# Patient Record
Sex: Female | Born: 1960 | Race: White | Hispanic: No | Smoking: Current every day smoker
Health system: Southern US, Community
[De-identification: ages and names within clinical notes are randomized; demographics above are authoritative.]

## PROBLEM LIST (undated history)

## (undated) DIAGNOSIS — G8929 Other chronic pain: Secondary | ICD-10-CM

## (undated) DIAGNOSIS — H409 Unspecified glaucoma: Secondary | ICD-10-CM

## (undated) DIAGNOSIS — J449 Chronic obstructive pulmonary disease, unspecified: Secondary | ICD-10-CM

## (undated) DIAGNOSIS — E039 Hypothyroidism, unspecified: Secondary | ICD-10-CM

## (undated) DIAGNOSIS — G459 Transient cerebral ischemic attack, unspecified: Secondary | ICD-10-CM

## (undated) DIAGNOSIS — I509 Heart failure, unspecified: Secondary | ICD-10-CM

## (undated) DIAGNOSIS — M549 Dorsalgia, unspecified: Secondary | ICD-10-CM

## (undated) DIAGNOSIS — E119 Type 2 diabetes mellitus without complications: Secondary | ICD-10-CM

## (undated) DIAGNOSIS — I10 Essential (primary) hypertension: Secondary | ICD-10-CM

## (undated) HISTORY — PX: CERVICAL SPINE SURGERY: SHX589

## (undated) HISTORY — PX: ABDOMINAL HYSTERECTOMY: SHX81

## (undated) HISTORY — PX: RECTAL TUMOR BY PROCTOTOMY EXCISION: SUR550

## (undated) HISTORY — PX: ROTATOR CUFF REPAIR: SHX139

## (undated) HISTORY — PX: APPENDECTOMY: SHX54

## (undated) HISTORY — PX: TONSILLECTOMY: SUR1361

## (undated) HISTORY — PX: CHOLECYSTECTOMY: SHX55

## (undated) HISTORY — PX: BACK SURGERY: SHX140

---

## 2008-06-12 ENCOUNTER — Emergency Department (HOSPITAL_BASED_OUTPATIENT_CLINIC_OR_DEPARTMENT_OTHER): Admission: EM | Admit: 2008-06-12 | Discharge: 2008-06-12 | Payer: Self-pay | Admitting: Emergency Medicine

## 2008-09-19 ENCOUNTER — Ambulatory Visit: Payer: Self-pay | Admitting: Diagnostic Radiology

## 2008-09-19 ENCOUNTER — Emergency Department (HOSPITAL_BASED_OUTPATIENT_CLINIC_OR_DEPARTMENT_OTHER): Admission: EM | Admit: 2008-09-19 | Discharge: 2008-09-19 | Payer: Self-pay | Admitting: Emergency Medicine

## 2008-09-19 ENCOUNTER — Emergency Department (HOSPITAL_BASED_OUTPATIENT_CLINIC_OR_DEPARTMENT_OTHER): Admission: EM | Admit: 2008-09-19 | Discharge: 2008-09-20 | Payer: Self-pay | Admitting: Emergency Medicine

## 2009-02-25 ENCOUNTER — Emergency Department (HOSPITAL_BASED_OUTPATIENT_CLINIC_OR_DEPARTMENT_OTHER): Admission: EM | Admit: 2009-02-25 | Discharge: 2009-02-26 | Payer: Self-pay | Admitting: Emergency Medicine

## 2009-02-26 ENCOUNTER — Ambulatory Visit: Payer: Self-pay | Admitting: Diagnostic Radiology

## 2009-02-27 ENCOUNTER — Emergency Department (HOSPITAL_COMMUNITY): Admission: EM | Admit: 2009-02-27 | Discharge: 2009-02-27 | Payer: Self-pay | Admitting: Emergency Medicine

## 2009-09-20 ENCOUNTER — Emergency Department (HOSPITAL_BASED_OUTPATIENT_CLINIC_OR_DEPARTMENT_OTHER): Admission: EM | Admit: 2009-09-20 | Discharge: 2009-09-20 | Payer: Self-pay | Admitting: Emergency Medicine

## 2009-09-20 ENCOUNTER — Ambulatory Visit: Payer: Self-pay | Admitting: Diagnostic Radiology

## 2010-02-11 ENCOUNTER — Emergency Department (HOSPITAL_BASED_OUTPATIENT_CLINIC_OR_DEPARTMENT_OTHER)
Admission: EM | Admit: 2010-02-11 | Discharge: 2010-02-11 | Disposition: A | Discharge status: Home / Self Care | Payer: Medicaid Other | Attending: Emergency Medicine | Admitting: Emergency Medicine

## 2010-02-11 DIAGNOSIS — E039 Hypothyroidism, unspecified: Secondary | ICD-10-CM | POA: Insufficient documentation

## 2010-02-11 DIAGNOSIS — G8929 Other chronic pain: Secondary | ICD-10-CM | POA: Insufficient documentation

## 2010-02-11 DIAGNOSIS — M5412 Radiculopathy, cervical region: Secondary | ICD-10-CM | POA: Insufficient documentation

## 2010-02-11 DIAGNOSIS — J4489 Other specified chronic obstructive pulmonary disease: Secondary | ICD-10-CM | POA: Insufficient documentation

## 2010-02-11 DIAGNOSIS — I252 Old myocardial infarction: Secondary | ICD-10-CM | POA: Insufficient documentation

## 2010-02-11 DIAGNOSIS — M79609 Pain in unspecified limb: Secondary | ICD-10-CM | POA: Insufficient documentation

## 2010-02-11 DIAGNOSIS — I10 Essential (primary) hypertension: Secondary | ICD-10-CM | POA: Insufficient documentation

## 2010-02-11 DIAGNOSIS — F17200 Nicotine dependence, unspecified, uncomplicated: Secondary | ICD-10-CM | POA: Insufficient documentation

## 2010-02-11 DIAGNOSIS — J449 Chronic obstructive pulmonary disease, unspecified: Secondary | ICD-10-CM | POA: Insufficient documentation

## 2010-03-29 LAB — DIFFERENTIAL
Basophils Absolute: 0.1 10*3/uL (ref 0.0–0.1)
Basophils Relative: 1 % (ref 0–1)
Eosinophils Absolute: 0.2 10*3/uL (ref 0.0–0.7)
Lymphs Abs: 2.8 10*3/uL (ref 0.7–4.0)
Monocytes Relative: 6 % (ref 3–12)
Neutro Abs: 6.4 10*3/uL (ref 1.7–7.7)

## 2010-03-29 LAB — CBC
MCHC: 34.8 g/dL (ref 30.0–36.0)
Platelets: 208 10*3/uL (ref 150–400)
RBC: 4.59 MIL/uL (ref 3.87–5.11)
RDW: 12.8 % (ref 11.5–15.5)

## 2010-03-29 LAB — BASIC METABOLIC PANEL
BUN: 14 mg/dL (ref 6–23)
GFR calc Af Amer: >60 mL/min (ref >60)
Glucose, Bld: 191 mg/dL — ABNORMAL HIGH (ref 70–99)
Sodium: 142 mEq/L (ref 135–145)

## 2010-03-29 LAB — POCT B-TYPE NATRIURETIC PEPTIDE (BNP): B Natriuretic Peptide, POC: <5 pg/mL (ref 0–100)

## 2010-03-29 LAB — POCT CARDIAC MARKERS
CKMB, poc: 1.9 ng/mL (ref 1.0–8.0)
Troponin i, poc: <0.05 ng/mL (ref 0.00–0.09)

## 2010-03-31 LAB — BASIC METABOLIC PANEL
CO2: 28 mEq/L (ref 19–32)
Calcium: 9.1 mg/dL (ref 8.4–10.5)
Chloride: 103 mEq/L (ref 96–112)
Creatinine, Ser: 0.94 mg/dL (ref 0.4–1.2)
GFR calc Af Amer: >60 mL/min (ref >60)
Glucose, Bld: 79 mg/dL (ref 70–99)
Potassium: 3.7 mEq/L (ref 3.5–5.1)

## 2010-03-31 LAB — POCT CARDIAC MARKERS
CKMB, poc: 2.8 ng/mL (ref 1.0–8.0)
Myoglobin, poc: 127 ng/mL (ref 12–200)

## 2010-03-31 LAB — CBC
HCT: 42 % (ref 36.0–46.0)
Hemoglobin: 14.3 g/dL (ref 12.0–15.0)
WBC: 9.3 10*3/uL (ref 4.0–10.5)

## 2010-03-31 LAB — DIFFERENTIAL
Basophils Absolute: 0.1 10*3/uL (ref 0.0–0.1)
Basophils Relative: 1 % (ref 0–1)
Lymphs Abs: 2.7 10*3/uL (ref 0.7–4.0)
Monocytes Absolute: 0.6 10*3/uL (ref 0.1–1.0)
Monocytes Relative: 7 % (ref 3–12)

## 2010-04-14 LAB — URINALYSIS, ROUTINE W REFLEX MICROSCOPIC
Bilirubin Urine: NEGATIVE
Ketones, ur: NEGATIVE mg/dL
Ketones, ur: NEGATIVE mg/dL
Nitrite: NEGATIVE
Nitrite: NEGATIVE
Protein, ur: NEGATIVE mg/dL
Specific Gravity, Urine: 1.017 (ref 1.005–1.030)
Urobilinogen, UA: 0.2 mg/dL (ref 0.0–1.0)
pH: 6 (ref 5.0–8.0)

## 2010-04-18 ENCOUNTER — Emergency Department (INDEPENDENT_AMBULATORY_CARE_PROVIDER_SITE_OTHER): Payer: Medicaid Other

## 2010-04-18 ENCOUNTER — Emergency Department (HOSPITAL_BASED_OUTPATIENT_CLINIC_OR_DEPARTMENT_OTHER)
Admission: EM | Admit: 2010-04-18 | Discharge: 2010-04-19 | Disposition: A | Discharge status: Home / Self Care | Payer: Medicaid Other | Attending: Emergency Medicine | Admitting: Emergency Medicine

## 2010-04-18 DIAGNOSIS — G8929 Other chronic pain: Secondary | ICD-10-CM | POA: Insufficient documentation

## 2010-04-18 DIAGNOSIS — J4489 Other specified chronic obstructive pulmonary disease: Secondary | ICD-10-CM | POA: Insufficient documentation

## 2010-04-18 DIAGNOSIS — W010XXA Fall on same level from slipping, tripping and stumbling without subsequent striking against object, initial encounter: Secondary | ICD-10-CM

## 2010-04-18 DIAGNOSIS — I252 Old myocardial infarction: Secondary | ICD-10-CM | POA: Insufficient documentation

## 2010-04-18 DIAGNOSIS — F17200 Nicotine dependence, unspecified, uncomplicated: Secondary | ICD-10-CM | POA: Insufficient documentation

## 2010-04-18 DIAGNOSIS — Y92009 Unspecified place in unspecified non-institutional (private) residence as the place of occurrence of the external cause: Secondary | ICD-10-CM | POA: Insufficient documentation

## 2010-04-18 DIAGNOSIS — J449 Chronic obstructive pulmonary disease, unspecified: Secondary | ICD-10-CM | POA: Insufficient documentation

## 2010-04-18 DIAGNOSIS — M25529 Pain in unspecified elbow: Secondary | ICD-10-CM

## 2010-04-18 DIAGNOSIS — S5000XA Contusion of unspecified elbow, initial encounter: Secondary | ICD-10-CM | POA: Insufficient documentation

## 2010-07-31 ENCOUNTER — Encounter: Payer: Self-pay | Admitting: *Deleted

## 2010-07-31 ENCOUNTER — Emergency Department (HOSPITAL_BASED_OUTPATIENT_CLINIC_OR_DEPARTMENT_OTHER)
Admission: EM | Admit: 2010-07-31 | Discharge: 2010-07-31 | Disposition: A | Discharge status: Home / Self Care | Payer: Medicaid Other | Attending: Emergency Medicine | Admitting: Emergency Medicine

## 2010-07-31 DIAGNOSIS — J4489 Other specified chronic obstructive pulmonary disease: Secondary | ICD-10-CM | POA: Insufficient documentation

## 2010-07-31 DIAGNOSIS — F17200 Nicotine dependence, unspecified, uncomplicated: Secondary | ICD-10-CM | POA: Insufficient documentation

## 2010-07-31 DIAGNOSIS — I10 Essential (primary) hypertension: Secondary | ICD-10-CM | POA: Insufficient documentation

## 2010-07-31 DIAGNOSIS — J449 Chronic obstructive pulmonary disease, unspecified: Secondary | ICD-10-CM | POA: Insufficient documentation

## 2010-07-31 DIAGNOSIS — I509 Heart failure, unspecified: Secondary | ICD-10-CM | POA: Insufficient documentation

## 2010-07-31 DIAGNOSIS — K602 Anal fissure, unspecified: Secondary | ICD-10-CM | POA: Insufficient documentation

## 2010-07-31 HISTORY — DX: Chronic obstructive pulmonary disease, unspecified: J44.9

## 2010-07-31 HISTORY — DX: Essential (primary) hypertension: I10

## 2010-07-31 HISTORY — DX: Heart failure, unspecified: I50.9

## 2010-07-31 MED ORDER — LIDOCAINE HCL 2 % EX GEL: Freq: Three times a day (TID) | CUTANEOUS | Status: AC

## 2010-07-31 NOTE — ED Provider Notes (Signed)
History     Chief Complaint  Patient presents with  . Tailbone Pain   HPI Comments: Pt statea that she started having rectal pain yesterday.pt denies any constipation or bleeding. Pt states that she took vicodin with some relief.  The history is provided by the patient. No language interpreter was used.    Past Medical History  Diagnosis Date  . Thyroid disease   . Hypertension   . CHF (congestive heart failure)   . COPD (chronic obstructive pulmonary disease)     Past Surgical History  Procedure Date  . Back surgery   . Cholecystectomy   . Abdominal hysterectomy   . 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 No pertinent family history.  History  Substance Use Topics  . Smoking status: Current Everyday Smoker -- 2.0 packs/day  . Smokeless tobacco: Not on file  . Alcohol Use: No    OB History    Grav Para Term Preterm Abortions TAB SAB Ect Mult Living                  Review of Systems  Constitutional: Negative.   Respiratory: Negative.   Cardiovascular: Negative.   Genitourinary:       Rectal pain  Musculoskeletal: Negative.   Neurological: Negative.   Psychiatric/Behavioral: Negative.     Physical Exam  BP 151/75  Pulse 92  Temp(Src) 98 F (36.7 C) (Oral)  Resp 18  SpO2 96%  Physical Exam  Constitutional: She appears well-developed and well-nourished.  Cardiovascular: Normal rate and regular rhythm.   Pulmonary/Chest: Breath sounds normal.  Genitourinary:    Musculoskeletal: Normal range of motion.  Neurological: She is alert.  Skin: Skin is warm.    ED Course  Procedures  MDM Pt has a tear:no sign of abcess or infection noted to the area:will treat for comfort      Teressa Lower, NP 07/31/10 2024

## 2010-07-31 NOTE — ED Notes (Signed)
Pt presents to ED today withs severe pain to rectal area.  Pt describes as sharp intermittent pain.  Pt took Vicodin around 6pm with minimal relief of sx.

## 2010-07-31 NOTE — ED Notes (Signed)
C/o rectal pain for few days which feel the same as hemorrhoids that she has had in the past. Denies any rectal bleeding. Pt states that pain has not improved after taking vicodin at home. No N/V, no abd pain.

## 2010-08-01 NOTE — ED Provider Notes (Signed)
Medical screening examination/treatment/procedure(s) were performed by non-physician practitioner and as supervising physician I was immediately available for consultation/collaboration.  Suzane Vanderweide 08/01/10 0043 

## 2010-08-08 ENCOUNTER — Telehealth (HOSPITAL_BASED_OUTPATIENT_CLINIC_OR_DEPARTMENT_OTHER): Payer: Self-pay | Admitting: Emergency Medicine

## 2010-12-09 ENCOUNTER — Emergency Department (INDEPENDENT_AMBULATORY_CARE_PROVIDER_SITE_OTHER): Payer: Medicaid Other

## 2010-12-09 ENCOUNTER — Encounter (HOSPITAL_BASED_OUTPATIENT_CLINIC_OR_DEPARTMENT_OTHER): Payer: Self-pay | Admitting: *Deleted

## 2010-12-09 ENCOUNTER — Emergency Department (HOSPITAL_BASED_OUTPATIENT_CLINIC_OR_DEPARTMENT_OTHER)
Admission: EM | Admit: 2010-12-09 | Discharge: 2010-12-09 | Disposition: A | Discharge status: Home / Self Care | Payer: Medicaid Other | Attending: Emergency Medicine | Admitting: Emergency Medicine

## 2010-12-09 DIAGNOSIS — R0989 Other specified symptoms and signs involving the circulatory and respiratory systems: Secondary | ICD-10-CM

## 2010-12-09 DIAGNOSIS — H6690 Otitis media, unspecified, unspecified ear: Secondary | ICD-10-CM

## 2010-12-09 DIAGNOSIS — R05 Cough: Secondary | ICD-10-CM

## 2010-12-09 DIAGNOSIS — J111 Influenza due to unidentified influenza virus with other respiratory manifestations: Secondary | ICD-10-CM | POA: Insufficient documentation

## 2010-12-09 DIAGNOSIS — I10 Essential (primary) hypertension: Secondary | ICD-10-CM | POA: Insufficient documentation

## 2010-12-09 DIAGNOSIS — J449 Chronic obstructive pulmonary disease, unspecified: Secondary | ICD-10-CM | POA: Insufficient documentation

## 2010-12-09 DIAGNOSIS — R0602 Shortness of breath: Secondary | ICD-10-CM

## 2010-12-09 DIAGNOSIS — I509 Heart failure, unspecified: Secondary | ICD-10-CM | POA: Insufficient documentation

## 2010-12-09 DIAGNOSIS — R059 Cough, unspecified: Secondary | ICD-10-CM | POA: Insufficient documentation

## 2010-12-09 DIAGNOSIS — R42 Dizziness and giddiness: Secondary | ICD-10-CM

## 2010-12-09 DIAGNOSIS — J4489 Other specified chronic obstructive pulmonary disease: Secondary | ICD-10-CM | POA: Insufficient documentation

## 2010-12-09 DIAGNOSIS — F17200 Nicotine dependence, unspecified, uncomplicated: Secondary | ICD-10-CM | POA: Insufficient documentation

## 2010-12-09 DIAGNOSIS — E079 Disorder of thyroid, unspecified: Secondary | ICD-10-CM | POA: Insufficient documentation

## 2010-12-09 MED ORDER — MECLIZINE HCL 25 MG PO TABS: 25.0000 mg | ORAL_TABLET | Freq: Three times a day (TID) | ORAL | Status: AC | PRN

## 2010-12-09 MED ORDER — AZITHROMYCIN 250 MG PO TABS: 250.0000 mg | ORAL_TABLET | Freq: Every day | ORAL | Status: AC

## 2010-12-09 MED ORDER — MECLIZINE HCL 25 MG PO TABS
25.0000 mg | ORAL_TABLET | Freq: Once | ORAL | Status: AC
  Administered 2010-12-09: 25 mg via ORAL
  Filled 2010-12-09: qty 1

## 2010-12-09 MED ORDER — AZITHROMYCIN 250 MG PO TABS
500.0000 mg | ORAL_TABLET | Freq: Once | ORAL | Status: AC
  Administered 2010-12-09: 500 mg via ORAL
  Filled 2010-12-09: qty 2

## 2010-12-09 MED ORDER — ALBUTEROL SULFATE (5 MG/ML) 0.5% IN NEBU
2.5000 mg | INHALATION_SOLUTION | Freq: Once | RESPIRATORY_TRACT | Status: AC
  Administered 2010-12-09: 2.5 mg via RESPIRATORY_TRACT
  Filled 2010-12-09: qty 0.5

## 2010-12-09 MED ORDER — IPRATROPIUM BROMIDE 0.02 % IN SOLN
0.5000 mg | Freq: Once | RESPIRATORY_TRACT | Status: AC
  Administered 2010-12-09: 0.5 mg via RESPIRATORY_TRACT
  Filled 2010-12-09: qty 2.5

## 2010-12-09 MED ORDER — ALBUTEROL SULFATE HFA 108 (90 BASE) MCG/ACT IN AERS: 2.0000 | INHALATION_SPRAY | Freq: Four times a day (QID) | RESPIRATORY_TRACT | Status: DC | PRN

## 2010-12-09 MED ORDER — HYDROCOD POLST-CHLORPHEN POLST 10-8 MG/5ML PO LQCR: 5.0000 mL | Freq: Two times a day (BID) | ORAL | Status: DC | PRN

## 2010-12-09 NOTE — Discharge Instructions (Signed)
Albuterol inhalation aerosol What is this medicine? ALBUTEROL (al Gaspar Bidding) is a bronchodilator. It helps open up the airways in your lungs to make it easier to breathe. This medicine is used to treat and to prevent bronchospasm. This medicine may be used for other purposes; ask your health care provider or pharmacist if you have questions. What should I tell my health care provider before I take this medicine? They need to know if you have any of the following conditions: -diabetes -heart disease or irregular heartbeat -high blood pressure -pheochromocytoma -seizures -thyroid disease -an unusual or allergic reaction to albuterol, levalbuterol, sulfites, other medicines, foods, dyes, or preservatives -pregnant or trying to get pregnant -breast-feeding How should I use this medicine? This medicine is for inhalation through the mouth. Follow the directions on your prescription label. Take your medicine at regular intervals. Do not use more often than directed. Make sure that you are using your inhaler correctly. Ask you doctor or health care provider if you have any questions. Use this medicine before you use any other inhaler. Wait 5 minutes or more before between using different inhalers. Talk to your pediatrician regarding the use of this medicine in children. Special care may be needed. Overdosage: If you think you have taken too much of this medicine contact a poison control center or emergency room at once. NOTE: This medicine is only for you. Do not share this medicine with others. What if I miss a dose? If you miss a dose, use it as soon as you can. If it is almost time for your next dose, use only that dose. Do not use double or extra doses. What may interact with this medicine? -anti-infectives like chloroquine and pentamidine -caffeine -cisapride -diuretics -medicines for colds -medicines for depression or for emotional or psychotic conditions -medicines for weight loss  including some herbal products -methadone -some antibiotics like clarithromycin, erythromycin, levofloxacin, and linezolid -some heart medicines -steroid hormones like dexamethasone, cortisone, hydrocortisone -theophylline -thyroid hormones This list may not describe all possible interactions. Give your health care provider a list of all the medicines, herbs, non-prescription drugs, or dietary supplements you use. Also tell them if you smoke, drink alcohol, or use illegal drugs. Some items may interact with your medicine. What should I watch for while using this medicine? Tell your doctor or health care professional if your symptoms do not improve. Do not use extra albuterol. If your asthma or bronchitis gets worse while you are using this medicine, call your doctor right away. If your mouth gets dry try chewing sugarless gum or sucking hard candy. Drink water as directed. What side effects may I notice from receiving this medicine? Side effects that you should report to your doctor or health care professional as soon as possible: -allergic reactions like skin rash, itching or hives, swelling of the face, lips, or tongue -breathing problems -chest pain -feeling faint or lightheaded, falls -high blood pressure -irregular heartbeat -fever -muscle cramps or weakness -pain, tingling, numbness in the hands or feet -vomiting Side effects that usually do not require medical attention (report to your doctor or health care professional if they continue or are bothersome): -cough -difficulty sleeping -headache -nervousness or trembling -stomach upset -stuffy or runny nose -throat irritation -unusual taste This list may not describe all possible side effects. Call your doctor for medical advice about side effects. You may report side effects to FDA at 1-800-FDA-1088. Where should I keep my medicine? Keep out of the reach of children. Store at  room temperature between 15 and 30 degrees C (59  and 86 degrees F). The contents are under pressure and may burst when exposed to heat or flame. Do not freeze. This medicine does not work as well if it is too cold. Throw away any unused medicine after the expiration date. Inhalers need to be thrown away after the labeled number of puffs have been used or by the expiration date; whichever comes first. Ventolin HFA should be thrown away 12 months after removing from foil pouch. Check the instructions that come with your medicine. NOTE: This sheet is a summary. It may not cover all possible information. If you have questions about this medicine, talk to your doctor, pharmacist, or health care provider.  2012, [employer]. (05/12/2010 11:00:52 AM)Meclizine tablets or capsules What is this medicine? MECLIZINE (MEK li zeen) is an antihistamine. It is used to prevent nausea, vomiting, or dizziness caused by motion sickness. It is also used to prevent and treat vertigo (extreme dizziness or a feeling that you or your surroundings are tilting or spinning around). This medicine may be used for other purposes; ask your health care provider or pharmacist if you have questions. What should I tell my health care provider before I take this medicine? They need to know if you have any of these conditions: -asthma -glaucoma -prostate trouble -stomach problems -urinary problems -an unusual or allergic reaction to meclizine, other medicines, foods, dyes, or preservatives -pregnant or trying to get pregnant -breast-feeding How should I use this medicine? Take this medicine by mouth with a glass of water. Follow the directions on the prescription label. If you are using this medicine to prevent motion sickness, take the dose at least 1 hour before travel. If it upsets your stomach, take it with food or milk. Take your doses at regular intervals. Do not take your medicine more often than directed. Talk to your pediatrician regarding the use of this medicine  in children. Special care may be needed. Overdosage: If you think you have taken too much of this medicine contact a poison control center or emergency room at once. NOTE: This medicine is only for you. Do not share this medicine with others. What if I miss a dose? If you miss a dose, take it as soon as you can. If it is almost time for your next dose, take only that dose. Do not take double or extra doses. What may interact with this medicine? -barbiturate medicines for inducing sleep or treating seizures -digoxin -medicines for anxiety or sleeping problems, like alprazolam, diazepam or temazepam -medicines for hay fever and other allergies -medicines for mental depression -medicines for movement abnormalities as in Parkinson's disease, or for stomach problems -medicines for pain -medicines that relax muscles This list may not describe all possible interactions. Give your health care provider a list of all the medicines, herbs, non-prescription drugs, or dietary supplements you use. Also tell them if you smoke, drink alcohol, or use illegal drugs. Some items may interact with your medicine. What should I watch for while using this medicine? If you are taking this medicine on a regular schedule, visit your doctor or health care professional for regular checks on your progress. You may get dizzy, drowsy or have blurred vision. Do not drive, use machinery, or do anything that needs mental alertness until you know how this medicine affects you. Do not stand or sit up quickly, especially if you are an older patient. This reduces the risk of dizzy or fainting spells.  Alcohol can increase possible dizziness. Avoid alcoholic drinks. Your mouth may get dry. Chewing sugarless gum or sucking hard candy, and drinking plenty of water may help. Contact your doctor if the problem does not go away or is severe. This medicine may cause dry eyes and blurred vision. If you wear contact lenses you may feel some  discomfort. Lubricating drops may help. See your eye doctor if the problem does not go away or is severe. What side effects may I notice from receiving this medicine? Side effects that you should report to your doctor or health care professional as soon as possible: -fainting spells -fast or irregular heartbeat Side effects that usually do not require medical attention (report to your doctor or health care professional if they continue or are bothersome): -constipation -difficulty passing urine -difficulty sleeping -headache -stomach upset This list may not describe all possible side effects. Call your doctor for medical advice about side effects. You may report side effects to FDA at 1-800-FDA-1088. Where should I keep my medicine? Keep out of the reach of children. Store at room temperature between 15 and 30 degrees C (59 and 86 degrees F). Keep container tightly closed. Throw away any unused medicine after the expiration date. NOTE: This sheet is a summary. It may not cover all possible information. If you have questions about this medicine, talk to your doctor, pharmacist, or health care provider.  2012, [employer] Standard. (07/03/2007 10:35:36 AM)Hydrocodone oral syrup  What is this medicine? HYDROCODONE (hye droe KOE done) is used to help relieve cough. This medicine may be used for other purposes; ask your health care provider or pharmacist if you have questions. What should I tell my health care provider before I take this medicine? They need to know if you have any of these conditions: -brain tumor -drug abuse or addiction -head injury -heart disease -if you frequently drink alcohol-containing drinks -kidney disease -liver disease -lung disease, asthma, or breathing problems -mental problems -an allergic reaction to hydrocodone, other opioid analgesics, other medicines, foods, dyes, or preservatives -pregnant or trying to get pregnant -breast-feeding How should I use  this medicine? Take this medicine by mouth with a full glass of water. Use a specially marked spoon or dropper to measure your dose. Ask your pharmacist if you do not have one. Do not use a household spoon. Follow the directions on the prescription label. Take your medicine at regular intervals. Do not take your medicine more often than directed. Talk to your pediatrician regarding the use of this medicine in children. This medicine is not approved for use in children less than 18 years old. Patients over 17 years old may have a stronger reaction and need a smaller dose. Overdosage: If you think you have taken too much of this medicine contact a poison control center or emergency room at once. NOTE: This medicine is only for you. Do not share this medicine with others. What if I miss a dose? If you miss a dose, take it as soon as you can. If it is almost time for your next dose, take only that dose. Do not take double or extra doses. What may interact with this medicine? -alcohol or medicines that contain alcohol -antihistamines -MAOIs like Carbex, Eldepryl, Marplan, Nardil, and Parnate -medicines for depression, anxiety, or psychotic disturbances -medicines for pain -medicines for sleep -naltrexone -tricyclic antidepressants like amitriptyline, clomipramine, desipramine, doxepin, imipramine, nortriptyline, and protriptyline This list may not describe all possible interactions. Give your health care provider a list of  all the medicines, herbs, non-prescription drugs, or dietary supplements you use. Also tell them if you smoke, drink alcohol, or use illegal drugs. Some items may interact with your medicine. What should I watch for while using this medicine? You may develop tolerance to this medicine if you take it for a long time. Tolerance means that you will get less cough relief with time. Tell your doctor or health care professional if your symptoms do not improve or if they get worse. You may  get drowsy or dizzy. Do not drive, use machinery, or do anything that needs mental alertness until you know how this medicine affects you. Do not stand or sit up quickly, especially if you are an older patient. This reduces the risk of dizzy or fainting spells. Alcohol may interfere with the effect of this medicine. Avoid alcoholic drinks. This medicine will cause constipation. Try to have a bowel movement at least every 2 to 3 days. If you do not have a bowel movement for 3 days, call your doctor or health care professional. What side effects may I notice from receiving this medicine? Side effects that you should report to your doctor or health care professional as soon as possible: -allergic reactions like skin rash, itching or hives, swelling of the face, lips, or tongue -breathing difficulties, wheezing -confusion -light headedness or fainting spells Side effects that usually do not require medical attention (report to your doctor or health care professional if they continue or are bothersome): -dizziness -drowsiness -itching -nausea -vomiting This list may not describe all possible side effects. Call your doctor for medical advice about side effects. You may report side effects to FDA at 1-800-FDA-1088. Where should I keep my medicine? Keep out of the reach of children. This medicine can be abused. Keep your medicine in a safe place to protect it from theft. Do not share this medicine with anyone. Selling or giving away this medicine is dangerous and against the law. Store at room temperature between 15 and 30 degrees C (59 and 86 degrees F). Protect from light. Throw away any unused medicine after the expiration date. NOTE: This sheet is a summary. It may not cover all possible information. If you have questions about this medicine, talk to your doctor, pharmacist, or health care provider.  2012, [employer]/Gold Standard. (05/09/2007 2:42:20 PM)Vertigo Vertigo means you feel like you or your  surroundings are moving when they are not. Vertigo can be dangerous if it occurs when you are at work, driving, or performing difficult activities.  CAUSES  Vertigo occurs when there is a conflict of signals sent to your brain from the visual and sensory systems in your body. There are many different causes of vertigo, including:  Infections, especially in the inner ear.   A bad reaction to a drug or misuse of alcohol and medicines.   Withdrawal from drugs or alcohol.   Rapidly changing positions, such as lying down or rolling over in bed.   A migraine headache.   Decreased blood flow to the brain.   Increased pressure in the brain from a head injury, infection, tumor, or bleeding.  SYMPTOMS  You may feel as though the world is spinning around or you are falling to the ground. Because your balance is upset, vertigo can cause nausea and vomiting. You may have involuntary eye movements (nystagmus). DIAGNOSIS  Vertigo is usually diagnosed by physical exam. If the cause of your vertigo is unknown, your caregiver may perform imaging tests, such as an MRI  scan (magnetic resonance imaging). TREATMENT  Most cases of vertigo resolve on their own, without treatment. Depending on the cause, your caregiver may prescribe certain medicines. If your vertigo is related to body position issues, your caregiver may recommend movements or procedures to correct the problem. In rare cases, if your vertigo is caused by certain inner ear problems, you may need surgery. HOME CARE INSTRUCTIONS   Follow your caregiver's instructions.   Avoid driving.   Avoid operating heavy machinery.   Avoid performing any tasks that would be dangerous to you or others during a vertigo episode.   Tell your caregiver if you notice that certain medicines seem to be causing your vertigo. Some of the medicines used to treat vertigo episodes can actually make them worse in some people.  SEEK IMMEDIATE MEDICAL CARE IF:   Your  medicines do not relieve your vertigo or are making it worse.   You develop problems with talking, walking, weakness, or using your arms, hands, or legs.   You develop severe headaches.   Your nausea or vomiting continues or gets worse.   You develop visual changes.   A family member notices behavioral changes.   Your condition gets worse.  MAKE SURE YOU:  Understand these instructions.   Will watch your condition.   Will get help right away if you are not doing well or get worse.  Document Released: 10/04/2004 Document Revised: 09/06/2010 Document Reviewed: 07/13/2010 [facility] Patient Information 2012 [place], [place].Otitis Media, Adult A middle ear infection is an infection in the space behind the eardrum. The medical name for this is "otitis media." It may happen after a common cold. It is caused by a germ that starts growing in that space. You may feel swollen glands in your neck on the side of the ear infection. HOME CARE INSTRUCTIONS   Take your medicine as directed until it is gone, even if you feel better after the first few days.   Only take over-the-counter or prescription medicines for pain, discomfort, or fever as directed by your caregiver.   Occasional use of a nasal decongestant a couple times per day may help with discomfort and help the eustachian tube to drain better.  Follow up with your caregiver in 10 to 14 days or as directed, to be certain that the infection has cleared. Not keeping the appointment could result in a chronic or permanent injury, pain, hearing loss and disability. If there is any problem keeping the appointment, you must call back to this facility for assistance. SEEK IMMEDIATE MEDICAL CARE IF:   You are not getting better in 2 to 3 days.   You have pain that is not controlled with medication.   You feel worse instead of better.   You cannot use the medication as directed.   You develop swelling, redness or pain around the ear or  stiffness in your neck.  MAKE SURE YOU:   Understand these instructions.   Will watch your condition.   Will get help right away if you are not doing well or get worse.  Document Released: 09/30/2003 Document Revised: 09/06/2010 Document Reviewed: 08/01/2007 [facility] Patient Information 2012 [place], [place].Influenza Facts Flu (influenza) is a contagious respiratory illness caused by the influenza viruses. It can cause mild to severe illness. While most healthy people recover from the flu without specific treatment and without complications, older people, young children, and people with certain health conditions are at higher risk for serious complications from the flu, including death. CAUSES  The flu virus is spread from person to person by respiratory droplets from coughing and sneezing.   A person can also become infected by touching an object or surface with a virus on it and then touching their mouth, eye or nose.   Adults may be able to infect others from 1 day before symptoms occur and up to 7 days after getting sick. So it is possible to give someone the flu even before you know you are sick and continue to infect others while you are sick.  SYMPTOMS   Fever (usually high).   Headache.   Tiredness (can be extreme).   Cough.   Sore throat.   Runny or stuffy nose.   Body aches.   Diarrhea and vomiting may also occur, particularly in children.   These symptoms are referred to as "flu-like symptoms". A lot of different illnesses, including the common cold, can have similar symptoms.  DIAGNOSIS   There are tests that can determine if you have the flu as long you are tested within the first 2 or 3 days of illness.   A doctor's exam and additional tests may be needed to identify if you have a disease that is a complicating the flu.  RISKS AND COMPLICATIONS  Some of the complications caused by the flu include:  Bacterial pneumonia or progressive pneumonia caused by the  flu virus.   Loss of body fluids (dehydration).   Worsening of chronic medical conditions, such as heart failure, asthma, or diabetes.   Sinus problems and ear infections.  HOME CARE INSTRUCTIONS   Seek medical care early on.   If you are at high risk from complications of the flu, consult your health-care provider as soon as you develop flu-like symptoms. Those at high risk for complications include:   People 65 years or older.   People with chronic medical conditions, including diabetes.   Pregnant women.   Young children.   Your caregiver may recommend use of an antiviral medication to help treat the flu.   If you get the flu, get plenty of rest, drink a lot of liquids, and avoid using alcohol and tobacco.   You can take over-the-counter medications to relieve the symptoms of the flu if your caregiver approves. (Never give aspirin to children or teenagers who have flu-like symptoms, particularly fever).  PREVENTION  The single best way to prevent the flu is to get a flu vaccine each fall. Other measures that can help protect against the flu are:  Antiviral Medications   A number of antiviral drugs are approved for use in preventing the flu. These are prescription medications, and a doctor should be consulted before they are used.   Habits for Good Health   Cover your nose and mouth with a tissue when you cough or sneeze, throw the tissue away after you use it.   Wash your hands often with soap and water, especially after you cough or sneeze. If you are not near water, use an alcohol-based hand cleaner.   Avoid people who are sick.   If you get the flu, stay home from work or school. Avoid contact with other people so that you do not make them sick, too.   Try not to touch your eyes, nose, or mouth as germs ore often spread this way.  IN CHILDREN, EMERGENCY WARNING SIGNS THAT NEED URGENT MEDICAL ATTENTION:  Fast breathing or trouble breathing.   Bluish skin color.     Not drinking enough fluids.  Not waking up or not interacting.   Being so irritable that the child does not want to be held.   Flu-like symptoms improve but then return with fever and worse cough.   Fever with a rash.  IN ADULTS, EMERGENCY WARNING SIGNS THAT NEED URGENT MEDICAL ATTENTION:  Difficulty breathing or shortness of breath.   Pain or pressure in the chest or abdomen.   Sudden dizziness.   Confusion.   Severe or persistent vomiting.  SEEK IMMEDIATE MEDICAL CARE IF:  You or someone you know is experiencing any of the symptoms above. When you arrive at the emergency center,report that you think you have the flu. You may be asked to wear a mask and/or sit in a secluded area to protect others from getting sick. MAKE SURE YOU:   Understand these instructions.   Monitor your condition.   Seek medical care if you are getting worse, or not improving.  Document Released: 12/28/2002 Document Revised: 09/06/2010 Document Reviewed: 09/23/2008 [facility] Patient Information 2012 [place], [place].

## 2010-12-09 NOTE — ED Provider Notes (Signed)
History  Scribed for Dione Booze, MD, the patient was seen in room MH11/MH11. This chart was scribed by Candelaria Stagers. The patient's care started at 5:54PM   CSN: [id number] Arrival date & time: 12/09/2010  5:46 PM   First MD Initiated Contact with Patient 12/09/10 1752      Chief Complaint  Patient presents with  . Cough     HPI Jamie Wiggins is a 50 y.o. female who presents to the Emergency Department complaining of cough and congestion that started about three days ago and has gotten worse today.  She is complaining of SOB that is associated with cough as well as dizziness and body aches.  She has no fever or chills.  Patient states she has not tried anything to alleviate sx.  She has a h/o COPD and congestive heart failure.  She recently stopped using inhaler for COPD about six months ago.  She smokes 1 1/2 packs per day.  Her PCP is Dr. Melvyn Novas.    Past Medical History  Diagnosis Date  . Thyroid disease   . Hypertension   . CHF (congestive heart failure)   . COPD (chronic obstructive pulmonary disease)     Past Surgical History  Procedure Date  . Back surgery   . Cholecystectomy   . Abdominal hysterectomy   . 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 No pertinent family history.  History  Substance Use Topics  . Smoking status: Current Everyday Smoker -- 2.0 packs/day  . Smokeless tobacco: Not on file  . Alcohol Use: No    OB History    Grav Para Term Preterm Abortions TAB SAB Ect Mult Living                  Review of Systems  Constitutional: Negative for fever and chills.  HENT: Negative for congestion.   Respiratory: Positive for cough and shortness of breath (associated with coughing).   Gastrointestinal: Negative for vomiting and diarrhea.  Neurological: Positive for dizziness.  10 Systems reviewed and are negative for acute change except as noted in the HPI.   Allergies  Demerol and Penicillins  Home Medications   Current  Outpatient Rx  Name Route Sig Dispense Refill  . FUROSEMIDE 20 MG PO TABS Oral Take 20 mg by mouth daily.     Marland Kitchen HYDROCODONE-ACETAMINOPHEN 10-325 MG PO TABS Oral Take 1 tablet by mouth every 6 (six) hours as needed. For pain      . LEVOTHYROXINE SODIUM 125 MCG PO TABS Oral Take 125 mcg by mouth daily.      Marland Kitchen LIDOCAINE HCL 2 % EX GEL Topical Apply topically 3 (three) times daily. Apply to the rectum 30 mL 0  . LISINOPRIL 20 MG PO TABS Oral Take 20 mg by mouth daily.      Marland Kitchen SIMVASTATIN 20 MG PO TABS Oral Take 20 mg by mouth daily.       BP 124/79  Pulse 78  Temp(Src) 98.1 F (36.7 C) (Oral)  Ht 5\' 1"  (1.549 m)  Wt 190 lb (86.183 kg)  BMI 35.90 kg/m2  SpO2 96%  Physical Exam  Nursing note and vitals reviewed. Constitutional: She is oriented to person, place, and time. She appears well-developed and well-nourished. No distress.  HENT:  Head: Normocephalic and atraumatic.  Mouth/Throat: Oropharynx is clear and moist.       Buldging and mild erythemia of both tympanic membranes. Tender palpaltion of both maxilarry sinuses.  Eyes: EOM are normal.  No scleral icterus.  Neck: Normal range of motion. Neck supple.  Cardiovascular: Normal rate and regular rhythm.   Pulmonary/Chest: She has wheezes (upper lobes).  Abdominal: Soft. She exhibits no distension.  Neurological: She is alert and oriented to person, place, and time.  Skin: Skin is warm and dry.  Psychiatric: She has a normal mood and affect. Her behavior is normal.    ED Course  Procedures   DIAGNOSTIC STUDIES: Oxygen Saturation is 96% on room air, normal by my interpretation.    COORDINATION OF CARE:  18:04 Ordered: ipratropium (ATROVENT) nebulizer solution 0.5 mg ; DG Chest 2 View ; meclizine (ANTIVERT) tablet 25 mg ; azithromycin (ZITHROMAX) tablet 500 mg, albuterol (PROVENTIL) (5 MG/ML) 0.5% nebulizer solution 2.5 mg     Labs Reviewed - No data to display Dg Chest 2 View  12/09/2010  *RADIOLOGY REPORT*  Clinical  Data: Cough, shortness of breath, congestion  CHEST - 2 VIEW  Comparison: CT chest dated February 26, 2009  Findings: The cardiac silhouette, mediastinum, pulmonary vasculature are within normal limits.  There is mild, diffuse prominence of the interstitial markings, consistent with an element of interstitial fibrotic disease.  No focal infiltrates or effusions are identified. A tiny right lung nodule is stable.  IMPRESSION: Mild chronic interstitial fibrotic changes.  Original Report Authenticated By: Brandon Melnick, M.D.     No diagnosis found.  Reevaluation at 7:25 PM: She states that she is not feeling any better, although she has not been coughing. On reexam, the lungs are clear.  MDM  Probable influenza with secondary otitis media and secondary vertigo.   I personally performed the services described in this documentation, which was scribed in my presence. The recorded information has been reviewed and considered.   Dione Booze, MD 12/09/10 [phone]

## 2010-12-09 NOTE — ED Notes (Signed)
Pt states she has had congestion/ cough for a couple days. Dizzy. Ears feel stopped up. "Tunnel"

## 2011-05-31 ENCOUNTER — Emergency Department (HOSPITAL_BASED_OUTPATIENT_CLINIC_OR_DEPARTMENT_OTHER)
Admission: EM | Admit: 2011-05-31 | Discharge: 2011-05-31 | Disposition: A | Discharge status: Home / Self Care | Payer: Medicaid Other | Attending: Emergency Medicine | Admitting: Emergency Medicine

## 2011-05-31 ENCOUNTER — Encounter (HOSPITAL_BASED_OUTPATIENT_CLINIC_OR_DEPARTMENT_OTHER): Payer: Self-pay | Admitting: *Deleted

## 2011-05-31 DIAGNOSIS — I10 Essential (primary) hypertension: Secondary | ICD-10-CM | POA: Insufficient documentation

## 2011-05-31 DIAGNOSIS — J4489 Other specified chronic obstructive pulmonary disease: Secondary | ICD-10-CM | POA: Insufficient documentation

## 2011-05-31 DIAGNOSIS — R51 Headache: Secondary | ICD-10-CM

## 2011-05-31 DIAGNOSIS — I509 Heart failure, unspecified: Secondary | ICD-10-CM | POA: Insufficient documentation

## 2011-05-31 DIAGNOSIS — T63481A Toxic effect of venom of other arthropod, accidental (unintentional), initial encounter: Secondary | ICD-10-CM | POA: Insufficient documentation

## 2011-05-31 DIAGNOSIS — J449 Chronic obstructive pulmonary disease, unspecified: Secondary | ICD-10-CM | POA: Insufficient documentation

## 2011-05-31 DIAGNOSIS — T6391XA Toxic effect of contact with unspecified venomous animal, accidental (unintentional), initial encounter: Secondary | ICD-10-CM | POA: Insufficient documentation

## 2011-05-31 DIAGNOSIS — R229 Localized swelling, mass and lump, unspecified: Secondary | ICD-10-CM | POA: Insufficient documentation

## 2011-05-31 DIAGNOSIS — T7840XA Allergy, unspecified, initial encounter: Secondary | ICD-10-CM

## 2011-05-31 MED ORDER — KETOROLAC TROMETHAMINE 60 MG/2ML IM SOLN
60.0000 mg | Freq: Once | INTRAMUSCULAR | Status: AC
  Administered 2011-05-31: 60 mg via INTRAMUSCULAR
  Filled 2011-05-31: qty 2

## 2011-05-31 MED ORDER — DIPHENHYDRAMINE HCL 25 MG PO CAPS
25.0000 mg | ORAL_CAPSULE | Freq: Once | ORAL | Status: AC
  Administered 2011-05-31: 25 mg via ORAL
  Filled 2011-05-31 (×2): qty 1

## 2011-05-31 NOTE — Discharge Instructions (Signed)
Allergic Reaction  Allergic reactions can be caused by anything your body is sensitive to. Your body may be sensitive to food, medicines, molds, pollens, cockroaches, dust mites, pets, insect stings, and other things around you. An allergic reaction may cause puffiness (swelling), itching, sneezing, coughing, or problems breathing.   Allergies cannot be cured, but they can be controlled with medicine. Some allergies happen only at certain times of the year. Try to stay away from what causes your reaction if possible. Sometimes, it is hard to tell what causes your reaction.  HOME CARE  If you have a rash or red patches (hives) on your skin:   Take medicines as told by your doctor.   Do not drive or drink alcohol after taking medicines. They can make you sleepy.   Put cold cloths on your skin. Take baths in cool water. This will help your itching. Do not take hot baths or showers. Heat will make the itching worse.   If your allergies get worse, your doctor might give you other medicines. Talk to your doctor if problems continue.  GET HELP RIGHT AWAY IF:    You have trouble breathing.   You have a tight feeling in your chest or throat.   Your mouth gets puffy (swollen).   You have red, itchy patches on your skin (hives) that get worse.   You have itching all over your body.  MAKE SURE YOU:    Understand these instructions.   Will watch your condition.   Will get help right away if you are not doing well or get worse.  Document Released: 12/13/2008 Document Revised: 12/14/2010 Document Reviewed: 12/13/2008  [employer] Patient Information 2012 [employer].

## 2011-05-31 NOTE — ED Notes (Signed)
2 hours ago was bit by an insect. Tightness in her chest and sob. Does not appear to be in distress.

## 2011-05-31 NOTE — ED Provider Notes (Signed)
History     CSN: [id number]  Arrival date & time 05/31/11  1946   First MD Initiated Contact with Patient 05/31/11 2036      No chief complaint on file.   (Consider location/radiation/quality/duration/timing/severity/associated sxs/prior treatment) HPI Comments: Patient presents after an insect bit her on her right upper arm approximately 640 this evening.  She states that it hurt and she had immediate onset of headache and felt some tightness in her chest.  She states that now that chest tightness has resolved.  She had no nausea or vomiting.  She still has some mild headache.  She noted the swelling that was on her upper arm has decreased without intervention.  Patient called her primary care physician's nursing hotline and they directed her to come into the emergency department.  The history is provided by the patient. No language interpreter was used.    Past Medical History  Diagnosis Date  . Thyroid disease   . Hypertension   . CHF (congestive heart failure)   . COPD (chronic obstructive pulmonary disease)     Past Surgical History  Procedure Date  . Back surgery   . Cholecystectomy   . Abdominal hysterectomy   . 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 No pertinent family history.  History  Substance Use Topics  . Smoking status: Current Everyday Smoker -- 2.0 packs/day  . Smokeless tobacco: Not on file  . Alcohol Use: No    OB History    Grav Para Term Preterm Abortions TAB SAB Ect Mult Living                  Review of Systems  Constitutional: Negative.  Negative for fever and chills.  Eyes: Negative.  Negative for discharge and redness.  Respiratory: Negative.  Negative for cough and shortness of breath.   Cardiovascular: Negative.  Negative for chest pain.  Gastrointestinal: Negative.  Negative for nausea, vomiting, abdominal pain and diarrhea.  Genitourinary: Negative.  Negative for dysuria and vaginal discharge.  Musculoskeletal:  Negative.  Negative for back pain.  Skin: Negative for color change and rash.  Neurological: Positive for headaches. Negative for syncope.  Hematological: Negative.  Negative for adenopathy.  Psychiatric/Behavioral: Negative.  Negative for confusion.  All other systems reviewed and are negative.    Allergies  Demerol and Penicillins  Home Medications   Current Outpatient Rx  Name Route Sig Dispense Refill  . ALBUTEROL SULFATE HFA 108 (90 BASE) MCG/ACT IN AERS Inhalation Inhale 2 puffs into the lungs every 6 (six) hours as needed for wheezing. 1 Inhaler 0  . HYDROCOD POLST-CPM POLST ER 10-8 MG/5ML PO LQCR Oral Take 5 mLs by mouth every 12 (twelve) hours as needed. 115 mL 0  . FUROSEMIDE 20 MG PO TABS Oral Take 20 mg by mouth daily.     Marland Kitchen HYDROCODONE-ACETAMINOPHEN 10-325 MG PO TABS Oral Take 1 tablet by mouth every 6 (six) hours as needed. For pain      . LEVOTHYROXINE SODIUM 125 MCG PO TABS Oral Take 125 mcg by mouth daily.      Marland Kitchen LIDOCAINE HCL 2 % EX GEL Topical Apply topically 3 (three) times daily. Apply to the rectum 30 mL 0  . LISINOPRIL 20 MG PO TABS Oral Take 20 mg by mouth daily.      Marland Kitchen SIMVASTATIN 20 MG PO TABS Oral Take 20 mg by mouth daily.       BP 124/72  Pulse 88  Temp(Src)  98.1 F (36.7 C) (Oral)  Resp 20  SpO2 98%  Physical Exam  Nursing note and vitals reviewed. Constitutional: She is oriented to person, place, and time. She appears well-developed and well-nourished.  Non-toxic appearance. She does not have a sickly appearance.  HENT:  Head: Normocephalic and atraumatic.  Mouth/Throat: Oropharynx is clear and moist.       Uvula is midline and there is no swelling  Eyes: Conjunctivae, EOM and lids are normal. Pupils are equal, round, and reactive to light. No scleral icterus.  Neck: Trachea normal and normal range of motion. Neck supple.  Cardiovascular: Normal rate, regular rhythm and normal heart sounds.   Pulmonary/Chest: Effort normal. No respiratory  distress. She has no wheezes. She has rales.  Abdominal: Soft. Normal appearance. There is no tenderness. There is no rebound, no guarding and no CVA tenderness.  Musculoskeletal: Normal range of motion.  Lymphadenopathy:    She has no cervical adenopathy.  Neurological: She is alert and oriented to person, place, and time. She has normal strength.  Skin: Skin is warm, dry and intact. No rash noted.       1 cm slightly raised area on right upper arm.  No other hives are present.  Psychiatric: She has a normal mood and affect. Her behavior is normal. Judgment and thought content normal.    ED Course  Procedures (including critical care time)  Labs Reviewed - No data to display No results found.   No diagnosis found.    Date: 05/31/2011  Rate: 79  Rhythm: normal sinus rhythm  QRS Axis: normal  Intervals: normal  ST/T Wave abnormalities: normal  Conduction Disutrbances:none  Narrative Interpretation:   Old EKG Reviewed: unchanged from 02/27/2009   MDM  Patient with very mild possible allergic reaction.  She has no significant hives.  Because she did have some mild swelling at the site of the insect bite I will give her a dose of by mouth Benadryl.  Patient still reports headache which I will treat with Toradol here.  I believe patient is safe for discharge home.        Nat Christen, MD 05/31/11 2052

## 2012-01-11 ENCOUNTER — Encounter (HOSPITAL_BASED_OUTPATIENT_CLINIC_OR_DEPARTMENT_OTHER): Payer: Self-pay | Admitting: *Deleted

## 2012-01-11 ENCOUNTER — Emergency Department (HOSPITAL_BASED_OUTPATIENT_CLINIC_OR_DEPARTMENT_OTHER)
Admission: EM | Admit: 2012-01-11 | Discharge: 2012-01-11 | Disposition: A | Discharge status: Home / Self Care | Payer: Medicaid Other | Attending: Emergency Medicine | Admitting: Emergency Medicine

## 2012-01-11 DIAGNOSIS — J4489 Other specified chronic obstructive pulmonary disease: Secondary | ICD-10-CM | POA: Insufficient documentation

## 2012-01-11 DIAGNOSIS — F17200 Nicotine dependence, unspecified, uncomplicated: Secondary | ICD-10-CM | POA: Insufficient documentation

## 2012-01-11 DIAGNOSIS — J069 Acute upper respiratory infection, unspecified: Secondary | ICD-10-CM

## 2012-01-11 DIAGNOSIS — H6690 Otitis media, unspecified, unspecified ear: Secondary | ICD-10-CM | POA: Insufficient documentation

## 2012-01-11 DIAGNOSIS — I509 Heart failure, unspecified: Secondary | ICD-10-CM | POA: Insufficient documentation

## 2012-01-11 DIAGNOSIS — Z79899 Other long term (current) drug therapy: Secondary | ICD-10-CM | POA: Insufficient documentation

## 2012-01-11 DIAGNOSIS — J449 Chronic obstructive pulmonary disease, unspecified: Secondary | ICD-10-CM | POA: Insufficient documentation

## 2012-01-11 DIAGNOSIS — E079 Disorder of thyroid, unspecified: Secondary | ICD-10-CM | POA: Insufficient documentation

## 2012-01-11 DIAGNOSIS — I10 Essential (primary) hypertension: Secondary | ICD-10-CM | POA: Insufficient documentation

## 2012-01-11 MED ORDER — AZITHROMYCIN 250 MG PO TABS: ORAL_TABLET | ORAL | Status: DC

## 2012-01-11 MED ORDER — HYDROCOD POLST-CHLORPHEN POLST 10-8 MG/5ML PO LQCR: 5.0000 mL | Freq: Two times a day (BID) | ORAL | Status: DC | PRN

## 2012-01-11 NOTE — ED Notes (Signed)
Earache headache and sore throat for a few days.

## 2012-01-11 NOTE — ED Provider Notes (Signed)
History     CSN: [id number]  Arrival date & time 01/11/12  1731   First MD Initiated Contact with Patient 01/11/12 1848      Chief Complaint  Patient presents with  . Otalgia    (Consider location/radiation/quality/duration/timing/severity/associated sxs/prior treatment) HPI 52 year old female presents to the ED with chief complaint of right ear pain, headache, myalgias, malaise, nonproductive cough for the past 2 days.  Patient states that she can feel "fluid sloshing around" in her right ear.  She has severe pain that radiates into the jaw.  She has some scratchy sore throat without difficulty breathing or swallowing.  Denies fevers. Denies DOE, SOB, chest tightness or pressure, radiation to left arm, jaw or back, or diaphoresis. Denies dysuria, flank pain, suprapubic pain, frequency, urgency, or hematuria. Denies headaches, light headedness, weakness, visual disturbances. Denies abdominal pain, nausea, vomiting, diarrhea or constipation.  ' Past Medical History  Diagnosis Date  . Thyroid disease   . Hypertension   . CHF (congestive heart failure)   . COPD (chronic obstructive pulmonary disease)     Past Surgical History  Procedure Date  . Back surgery   . Cholecystectomy   . Abdominal hysterectomy   . 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  History  Substance Use Topics  . Smoking status: Current Every Day Smoker -- 2.0 packs/day    Types: Cigarettes  . Smokeless tobacco: Not on file  . Alcohol Use: No    OB History    Grav Para Term Preterm Abortions TAB SAB Ect Mult Living                  Review of Systems Ten systems reviewed and are negative for acute change, except as noted in the HPI.  Allergies  Demerol and Penicillins  Home Medications   Current Outpatient Rx  Name  Route  Sig  Dispense  Refill  . ALBUTEROL SULFATE HFA 108 (90 BASE) MCG/ACT IN AERS   Inhalation   Inhale 2 puffs into the lungs every 6 (six) hours as  needed for wheezing.   1 Inhaler   0   . HYDROCOD POLST-CPM POLST ER 10-8 MG/5ML PO LQCR   Oral   Take 5 mLs by mouth every 12 (twelve) hours as needed.   115 mL   0   . FUROSEMIDE 20 MG PO TABS   Oral   Take 20 mg by mouth daily.          Marland Kitchen HYDROCODONE-ACETAMINOPHEN 10-325 MG PO TABS   Oral   Take 1 tablet by mouth every 6 (six) hours as needed. For pain           . LEVOTHYROXINE SODIUM 125 MCG PO TABS   Oral   Take 125 mcg by mouth daily.           Marland Kitchen LISINOPRIL 20 MG PO TABS   Oral   Take 20 mg by mouth daily.           Marland Kitchen SIMVASTATIN 20 MG PO TABS   Oral   Take 20 mg by mouth daily.            BP 125/82  Pulse 87  Temp 98.4 F (36.9 C) (Oral)  Resp 20  SpO2 96%  Physical Exam Appears moderately ill but not toxic; temperature as noted in vitals. Ears: Right ear with bulging tympanic membrane.  There is purulence behind the eardrum in the middle ear.  Left TM with effusion.  No sign of infection to Eyes:glassy appearance, no discharge  Heart: RRR, NO M/G/R Throat and pharynx with mild erythema and cobblestoning consistent with postnasal drip.  There is no exudate. midline uvula Neck supple. No adenopathyhy in the neck.  Sinuses non tender.  The chest is clear. Abdomen is soft and nontende  ED Course  Procedures (including critical care time)  Labs Reviewed - No data to display No results found.   1. AOM (acute otitis media)   2. URI (upper respiratory infection)       MDM  Patient presents with otalgia and exam consistent with acute otitis media. No concern for acute mastoiditis, meningitis.  No antibiotic use in the last month.  Patient discharged home with z pack.   I have also discussed reasons to return immediately to the ER.  Patient expresses understanding and agrees with plan.          Arthor Captain, PA-C 01/12/12 0626  Arthor Captain, PA-C 01/12/12 [phone]

## 2012-01-12 NOTE — ED Provider Notes (Signed)
Medical screening examination/treatment/procedure(s) were performed by non-physician practitioner and as supervising physician I was immediately available for consultation/collaboration.  Doug Sou, MD 01/12/12 [phone]

## 2012-10-05 ENCOUNTER — Emergency Department (HOSPITAL_BASED_OUTPATIENT_CLINIC_OR_DEPARTMENT_OTHER)
Admission: EM | Admit: 2012-10-05 | Discharge: 2012-10-05 | Disposition: A | Discharge status: Home / Self Care | Payer: Medicaid Other | Attending: Emergency Medicine | Admitting: Emergency Medicine

## 2012-10-05 ENCOUNTER — Encounter (HOSPITAL_BASED_OUTPATIENT_CLINIC_OR_DEPARTMENT_OTHER): Payer: Self-pay | Admitting: *Deleted

## 2012-10-05 DIAGNOSIS — Z9889 Other specified postprocedural states: Secondary | ICD-10-CM | POA: Insufficient documentation

## 2012-10-05 DIAGNOSIS — Y939 Activity, unspecified: Secondary | ICD-10-CM | POA: Insufficient documentation

## 2012-10-05 DIAGNOSIS — E119 Type 2 diabetes mellitus without complications: Secondary | ICD-10-CM | POA: Insufficient documentation

## 2012-10-05 DIAGNOSIS — S239XXA Sprain of unspecified parts of thorax, initial encounter: Secondary | ICD-10-CM | POA: Insufficient documentation

## 2012-10-05 DIAGNOSIS — Z8673 Personal history of transient ischemic attack (TIA), and cerebral infarction without residual deficits: Secondary | ICD-10-CM | POA: Insufficient documentation

## 2012-10-05 DIAGNOSIS — E039 Hypothyroidism, unspecified: Secondary | ICD-10-CM | POA: Insufficient documentation

## 2012-10-05 DIAGNOSIS — H409 Unspecified glaucoma: Secondary | ICD-10-CM | POA: Insufficient documentation

## 2012-10-05 DIAGNOSIS — T148XXA Other injury of unspecified body region, initial encounter: Secondary | ICD-10-CM

## 2012-10-05 DIAGNOSIS — Z7982 Long term (current) use of aspirin: Secondary | ICD-10-CM | POA: Insufficient documentation

## 2012-10-05 DIAGNOSIS — Y929 Unspecified place or not applicable: Secondary | ICD-10-CM | POA: Insufficient documentation

## 2012-10-05 DIAGNOSIS — J449 Chronic obstructive pulmonary disease, unspecified: Secondary | ICD-10-CM | POA: Insufficient documentation

## 2012-10-05 DIAGNOSIS — Z79899 Other long term (current) drug therapy: Secondary | ICD-10-CM | POA: Insufficient documentation

## 2012-10-05 DIAGNOSIS — I10 Essential (primary) hypertension: Secondary | ICD-10-CM | POA: Insufficient documentation

## 2012-10-05 DIAGNOSIS — X58XXXA Exposure to other specified factors, initial encounter: Secondary | ICD-10-CM | POA: Insufficient documentation

## 2012-10-05 DIAGNOSIS — J4489 Other specified chronic obstructive pulmonary disease: Secondary | ICD-10-CM | POA: Insufficient documentation

## 2012-10-05 DIAGNOSIS — Z880 Allergy status to penicillin: Secondary | ICD-10-CM | POA: Insufficient documentation

## 2012-10-05 DIAGNOSIS — I509 Heart failure, unspecified: Secondary | ICD-10-CM | POA: Insufficient documentation

## 2012-10-05 DIAGNOSIS — F17200 Nicotine dependence, unspecified, uncomplicated: Secondary | ICD-10-CM | POA: Insufficient documentation

## 2012-10-05 HISTORY — DX: Dorsalgia, unspecified: M54.9

## 2012-10-05 HISTORY — DX: Hypothyroidism, unspecified: E03.9

## 2012-10-05 HISTORY — DX: Transient cerebral ischemic attack, unspecified: G45.9

## 2012-10-05 HISTORY — DX: Type 2 diabetes mellitus without complications: E11.9

## 2012-10-05 HISTORY — DX: Unspecified glaucoma: H40.9

## 2012-10-05 MED ORDER — IBUPROFEN 600 MG PO TABS: 600.0000 mg | ORAL_TABLET | Freq: Four times a day (QID) | ORAL | Status: DC | PRN

## 2012-10-05 MED ORDER — IBUPROFEN 400 MG PO TABS
600.0000 mg | ORAL_TABLET | Freq: Once | ORAL | Status: AC
  Administered 2012-10-05: 600 mg via ORAL
  Filled 2012-10-05 (×2): qty 1

## 2012-10-05 NOTE — ED Notes (Signed)
Pt reports 4 days of upper back pain behind left shoulder- denies injury- denies chest pain, denies SOB

## 2012-10-05 NOTE — ED Provider Notes (Signed)
CSN: [id number]     Arrival date & time 10/05/12  1530 History   First MD Initiated Contact with Patient 10/05/12 1532     Chief Complaint  Patient presents with  . Back Pain   (Consider location/radiation/quality/duration/timing/severity/associated sxs/prior Treatment) HPI Patient presents with 4 days of gradual onset left-sided thoracic back pain. Pain is worse with flexion of the neck. No known trauma. Patient is a had any new exercise routines. She has persistent cough due to her COPD that is unchanged. She has known new shortness of breath or chest pain. She has no new fevers or chills. No new extremity swelling or edema. Patient's been taking Flexeril and hydrocodone 10 mg at home with no relief. Past Medical History  Diagnosis Date  . Hypertension   . CHF (congestive heart failure)   . COPD (chronic obstructive pulmonary disease)   . Hypothyroidism   . Diabetes mellitus without complication   . Back pain   . TIA (transient ischemic attack)   . Glaucoma    Past Surgical History  Procedure Laterality Date  . Back surgery    . Cholecystectomy    . Abdominal hysterectomy    . 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 Tonsillectomy    . Cesarean section     No family history on file. History  Substance Use Topics  . Smoking status: Current Every Day Smoker -- 1.50 packs/day    Types: Cigarettes  . Smokeless tobacco: Never Used  . Alcohol Use: No   OB History   Grav Para Term Preterm Abortions TAB SAB Ect Mult Living                 Review of Systems  Constitutional: Negative for fever and chills.  HENT: Negative for neck pain and neck stiffness.   Respiratory: Positive for cough (chronic and unchanged). Negative for shortness of breath.   Cardiovascular: Negative for chest pain, palpitations and leg swelling.  Gastrointestinal: Negative for nausea, vomiting and abdominal pain.  Musculoskeletal: Positive for myalgias and back pain. Negative for arthralgias.  Skin:  Negative for rash and wound.  Neurological: Negative for weakness and numbness.  All other systems reviewed and are negative.    Allergies  Demerol and Penicillins  Home Medications   Current Outpatient Rx  Name  Route  Sig  Dispense  Refill  . aspirin 81 MG tablet   Oral   Take 81 mg by mouth daily.         Marland Kitchen atorvastatin (LIPITOR) 80 MG tablet   Oral   Take 80 mg by mouth daily.         . cyclobenzaprine (FLEXERIL) 10 MG tablet   Oral   Take 10 mg by mouth 3 (three) times daily as needed for muscle spasms.         . furosemide (LASIX) 20 MG tablet   Oral   Take 20 mg by mouth daily.          Marland Kitchen HYDROcodone-acetaminophen (NORCO) 10-325 MG per tablet   Oral   Take 1 tablet by mouth every 6 (six) hours as needed. For pain           . levothyroxine (SYNTHROID, LEVOTHROID) 125 MCG tablet   Oral   Take 125 mcg by mouth daily.           Marland Kitchen lisinopril (PRINIVIL,ZESTRIL) 20 MG tablet   Oral   Take 20 mg by mouth daily.           Marland Kitchen  metFORMIN (GLUCOPHAGE) 500 MG tablet   Oral   Take 500 mg by mouth daily with breakfast.         . EXPIRED: albuterol (PROVENTIL HFA;VENTOLIN HFA) 108 (90 BASE) MCG/ACT inhaler   Inhalation   Inhale 2 puffs into the lungs every 6 (six) hours as needed for wheezing.   1 Inhaler   0   . azithromycin (ZITHROMAX Z-PAK) 250 MG tablet      2 po day one, then 1 daily x 4 days   5 tablet   0   . chlorpheniramine-HYDROcodone (TUSSIONEX PENNKINETIC ER) 10-8 MG/5ML LQCR   Oral   Take 5 mLs by mouth every 12 (twelve) hours as needed.   115 mL   0   . chlorpheniramine-HYDROcodone (TUSSIONEX PENNKINETIC ER) 10-8 MG/5ML LQCR   Oral   Take 5 mLs by mouth every 12 (twelve) hours as needed.   140 mL   0   . ibuprofen (ADVIL,MOTRIN) 600 MG tablet   Oral   Take 1 tablet (600 mg total) by mouth every 6 (six) hours as needed for pain.   30 tablet   0   . simvastatin (ZOCOR) 20 MG tablet   Oral   Take 20 mg by mouth daily.            BP 154/79  Pulse 77  Temp(Src) 98.7 F (37.1 C) (Oral)  Resp 18  Ht 5\' 1"  (1.549 m)  Wt 182 lb (82.555 kg)  BMI 34.41 kg/m2  SpO2 98% Physical Exam  Nursing note and vitals reviewed. Constitutional: She is oriented to person, place, and time. She appears well-developed and well-nourished. No distress.  Patient in no apparent distress  HENT:  Head: Normocephalic and atraumatic.  Mouth/Throat: Oropharynx is clear and moist.  Eyes: EOM are normal. Pupils are equal, round, and reactive to light.  Neck: Normal range of motion. Neck supple.  The posterior cervical spine tenderness.  Cardiovascular: Normal rate and regular rhythm.   Pulmonary/Chest: Effort normal and breath sounds normal. No respiratory distress. She has no wheezes. She has no rales. She exhibits no tenderness.  Abdominal: Soft. Bowel sounds are normal.  Musculoskeletal: Normal range of motion. She exhibits tenderness (patient has tenderness to palpation over her left thoracic paraspinal and rhomboid muscles.. Pain is exacerbated with flexion and extension of her neck. Patient has no midline thoracic or lumbar tenderness.). She exhibits no edema.  Neurological: She is alert and oriented to person, place, and time.  5 strength in all extremities. Sensation is fully intact. Pendulous without difficulty.  Skin: Skin is warm and dry. No rash noted. No erythema.  Psychiatric:  Patient's mood is emotionally labile.    ED Course  Procedures (including critical care time) Labs Review Labs Reviewed - No data to display Imaging Review No results found.  MDM   1. Muscle strain    Patient requesting cortisone shot for her back. I informed her that her symptoms were consistent with muscle strain and cortisone injection was not indicated. I do not suspect a pulmonary or cardiac process. The patient has an ongoing chronic cough that is unchanged. She has no fevers or chills. Her lungs are clear and heart is regular  rate. The pain is not overlying a bony process. She's had no recent trauma. I don't believe x-rays would be of benefit in this situation. I suggested the patient start taking a nonsteroidal anti-inflammatory medication regular basis with food for thoracic back strain.  He does also been suggested  to help with her symptoms. I given her a handout on thoracic back strain. I given her discharge instructions to return for shortness of breath, fever, worsening pain or for any concerns.   Loren Racer, MD 10/05/12 1600

## 2012-10-25 ENCOUNTER — Emergency Department (HOSPITAL_BASED_OUTPATIENT_CLINIC_OR_DEPARTMENT_OTHER)
Admission: EM | Admit: 2012-10-25 | Discharge: 2012-10-25 | Disposition: A | Discharge status: Home / Self Care | Payer: Medicaid Other | Attending: Emergency Medicine | Admitting: Emergency Medicine

## 2012-10-25 ENCOUNTER — Encounter (HOSPITAL_BASED_OUTPATIENT_CLINIC_OR_DEPARTMENT_OTHER): Payer: Self-pay | Admitting: Emergency Medicine

## 2012-10-25 DIAGNOSIS — Z880 Allergy status to penicillin: Secondary | ICD-10-CM | POA: Insufficient documentation

## 2012-10-25 DIAGNOSIS — Z8673 Personal history of transient ischemic attack (TIA), and cerebral infarction without residual deficits: Secondary | ICD-10-CM | POA: Insufficient documentation

## 2012-10-25 DIAGNOSIS — G8929 Other chronic pain: Secondary | ICD-10-CM

## 2012-10-25 DIAGNOSIS — I10 Essential (primary) hypertension: Secondary | ICD-10-CM | POA: Insufficient documentation

## 2012-10-25 DIAGNOSIS — M62838 Other muscle spasm: Secondary | ICD-10-CM | POA: Insufficient documentation

## 2012-10-25 DIAGNOSIS — Z9889 Other specified postprocedural states: Secondary | ICD-10-CM | POA: Insufficient documentation

## 2012-10-25 DIAGNOSIS — E039 Hypothyroidism, unspecified: Secondary | ICD-10-CM | POA: Insufficient documentation

## 2012-10-25 DIAGNOSIS — Z79899 Other long term (current) drug therapy: Secondary | ICD-10-CM | POA: Insufficient documentation

## 2012-10-25 DIAGNOSIS — J4489 Other specified chronic obstructive pulmonary disease: Secondary | ICD-10-CM | POA: Insufficient documentation

## 2012-10-25 DIAGNOSIS — Z7982 Long term (current) use of aspirin: Secondary | ICD-10-CM | POA: Insufficient documentation

## 2012-10-25 DIAGNOSIS — I509 Heart failure, unspecified: Secondary | ICD-10-CM | POA: Insufficient documentation

## 2012-10-25 DIAGNOSIS — E119 Type 2 diabetes mellitus without complications: Secondary | ICD-10-CM | POA: Insufficient documentation

## 2012-10-25 DIAGNOSIS — M545 Low back pain, unspecified: Secondary | ICD-10-CM | POA: Insufficient documentation

## 2012-10-25 DIAGNOSIS — R064 Hyperventilation: Secondary | ICD-10-CM | POA: Insufficient documentation

## 2012-10-25 DIAGNOSIS — F17200 Nicotine dependence, unspecified, uncomplicated: Secondary | ICD-10-CM | POA: Insufficient documentation

## 2012-10-25 DIAGNOSIS — J449 Chronic obstructive pulmonary disease, unspecified: Secondary | ICD-10-CM | POA: Insufficient documentation

## 2012-10-25 HISTORY — DX: Other chronic pain: G89.29

## 2012-10-25 MED ORDER — LORAZEPAM 2 MG/ML IJ SOLN
1.0000 mg | Freq: Once | INTRAMUSCULAR | Status: AC
  Administered 2012-10-25: 1 mg via INTRAVENOUS
  Filled 2012-10-25: qty 1

## 2012-10-25 MED ORDER — HYDROMORPHONE HCL PF 1 MG/ML IJ SOLN
1.0000 mg | Freq: Once | INTRAMUSCULAR | Status: AC
  Administered 2012-10-25: 1 mg via INTRAVENOUS
  Filled 2012-10-25: qty 1

## 2012-10-25 MED ORDER — HYDROMORPHONE HCL 4 MG PO TABS: 4.0000 mg | ORAL_TABLET | ORAL | Status: DC | PRN

## 2012-10-25 MED ORDER — LORAZEPAM 1 MG PO TABS: 1.0000 mg | ORAL_TABLET | Freq: Four times a day (QID) | ORAL | Status: DC | PRN

## 2012-10-25 NOTE — ED Provider Notes (Signed)
CSN: [id number]     Arrival date & time 10/25/12  1704 History  This chart was scribed for Hurman Horn, MD by Danella Maiers, ED Scribe. This patient was seen in room MH10/MH10 and the patient's care was started at 5:11 PM.    Chief Complaint  Patient presents with  . Muscle Pain  . Hyperventilating   HPI HPI Comments: Jamie Wiggins is a 52 y.o. female with a h/o COPD and heart failure who presents to the Emergency Department complaining of SOB secondary to pain that started today. She reports sudden-onset severe pain and spasms in her entire body including neck back arms and legs and as a result she had trouble catching her breath. She has a h/o chronic lower back pain and the neck pain has been going on for one month.  She states this feels different from the SOB she gets from COPD. She denies numbness besides chronic unchanged right leg. She denies change in bowel or bladder function. She denies use of alcohol, tobacco, drugs.     Past Medical History  Diagnosis Date  . Hypertension   . CHF (congestive heart failure)   . COPD (chronic obstructive pulmonary disease)   . Hypothyroidism   . Diabetes mellitus without complication   . Back pain   . TIA (transient ischemic attack)   . Glaucoma   . Chronic pain    Past Surgical History  Procedure Laterality Date  . Back surgery    . Cholecystectomy    . Abdominal hysterectomy    . 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 Tonsillectomy    . Cesarean section     History reviewed. No pertinent family history. History  Substance Use Topics  . Smoking status: Current Every Day Smoker -- 1.50 packs/day    Types: Cigarettes  . Smokeless tobacco: Never Used  . Alcohol Use: No   OB History   Grav Para Term Preterm Abortions TAB SAB Ect Mult Living                 Review of Systems 10 Systems reviewed and are negative for acute change except as noted in the HPI. Allergies  Demerol and Penicillins  Home Medications    Current Outpatient Rx  Name  Route  Sig  Dispense  Refill  . EXPIRED: albuterol (PROVENTIL HFA;VENTOLIN HFA) 108 (90 BASE) MCG/ACT inhaler   Inhalation   Inhale 2 puffs into the lungs every 6 (six) hours as needed for wheezing.   1 Inhaler   0   . aspirin 81 MG tablet   Oral   Take 81 mg by mouth daily.         Marland Kitchen atorvastatin (LIPITOR) 80 MG tablet   Oral   Take 80 mg by mouth daily.         Marland Kitchen azithromycin (ZITHROMAX Z-PAK) 250 MG tablet      2 po day one, then 1 daily x 4 days   5 tablet   0   . chlorpheniramine-HYDROcodone (TUSSIONEX PENNKINETIC ER) 10-8 MG/5ML LQCR   Oral   Take 5 mLs by mouth every 12 (twelve) hours as needed.   115 mL   0   . chlorpheniramine-HYDROcodone (TUSSIONEX PENNKINETIC ER) 10-8 MG/5ML LQCR   Oral   Take 5 mLs by mouth every 12 (twelve) hours as needed.   140 mL   0   . cyclobenzaprine (FLEXERIL) 10 MG tablet   Oral   Take 10 mg by mouth  3 (three) times daily as needed for muscle spasms.         . furosemide (LASIX) 20 MG tablet   Oral   Take 20 mg by mouth daily.          Marland Kitchen HYDROcodone-acetaminophen (NORCO) 10-325 MG per tablet   Oral   Take 1 tablet by mouth every 6 (six) hours as needed. For pain           . HYDROmorphone (DILAUDID) 4 MG tablet   Oral   Take 1 tablet (4 mg total) by mouth every 4 (four) hours as needed for pain.   10 tablet   0   . ibuprofen (ADVIL,MOTRIN) 600 MG tablet   Oral   Take 1 tablet (600 mg total) by mouth every 6 (six) hours as needed for pain.   30 tablet   0   . levothyroxine (SYNTHROID, LEVOTHROID) 125 MCG tablet   Oral   Take 125 mcg by mouth daily.           Marland Kitchen lisinopril (PRINIVIL,ZESTRIL) 20 MG tablet   Oral   Take 20 mg by mouth daily.           Marland Kitchen LORazepam (ATIVAN) 1 MG tablet   Oral   Take 1 tablet (1 mg total) by mouth every 6 (six) hours as needed (spasms).   10 tablet   0   . metFORMIN (GLUCOPHAGE) 500 MG tablet   Oral   Take 500 mg by mouth daily with  breakfast.         . simvastatin (ZOCOR) 20 MG tablet   Oral   Take 20 mg by mouth daily.           BP 142/91  Pulse 111  Resp 16  Ht 5\' 1"  (1.549 m)  Wt 192 lb (87.091 kg)  BMI 36.3 kg/m2  SpO2 98% Physical Exam  Nursing note and vitals reviewed. Constitutional:  Awake, alert, nontoxic appearance.  HENT:  Head: Atraumatic.  Eyes: Right eye exhibits no discharge. Left eye exhibits no discharge.  Neck: Neck supple.   Diffuse posterior neck and back tenderness.  Cardiovascular: Normal rate and regular rhythm.   No murmur heard. Pulmonary/Chest: Effort normal and breath sounds normal. No respiratory distress. She has no wheezes. She has no rales. She exhibits no tenderness.  Abdominal: Soft. Bowel sounds are normal. She exhibits no distension. There is no tenderness. There is no rebound.  Musculoskeletal: She exhibits tenderness. She exhibits no edema.  Baseline ROM, no obvious new focal weakness.Baseline numbness right leg. Baseline slight numbness right medial foot. DP pulses intact feet. Diffuse posterior neck and back tenderness.  Neurological: She is alert.  Mental status and motor strength appears baseline for patient and situation.Baseline numbness right leg. Baseline slight numbness right medial foot. DP pulses intact feet. Diffuse posterior neck and back tenderness.  Skin: No rash noted.  Psychiatric:  Appears anxious    ED Course  Procedures (including critical care time) Medications  HYDROmorphone (DILAUDID) injection 1 mg (1 mg Intravenous Given 10/25/12 1736)  LORazepam (ATIVAN) injection 1 mg (1 mg Intravenous Given 10/25/12 1736)    DIAGNOSTIC STUDIES: Oxygen Saturation is 98% on RA, normal by my interpretation.    COORDINATION OF CARE: 5:29 PM- Discussed treatment plan with pt. Pt agrees to plan. Pt feels improved after observation and/or treatment in ED.Patient / Family / Caregiver informed of clinical course, understand medical decision-making  process, and agree with plan.    Labs Review Labs  Reviewed - No data to display Imaging Review No results found.  EKG Interpretation   None       MDM   1. Muscle spasms of neck   2. Back pain, chronic    I doubt any other EMC precluding discharge at this time including, but not necessarily limited to the following:CVA, radiculopathy, myelopathy.     I personally performed the services described in this documentation, which was scribed in my presence. The recorded information has been reviewed and is accurate.    Hurman Horn, MD 11/09/12 [phone]

## 2012-10-25 NOTE — ED Notes (Signed)
Pt states she felt like her muscles are having spasms.  Pt feels like she cannot breathe.  Pt holding her breath, crying.  Pt states she is taking her pain medication but still has pain.

## 2012-10-25 NOTE — ED Notes (Signed)
Pt provided ice chips on request.

## 2014-05-09 ENCOUNTER — Emergency Department (HOSPITAL_BASED_OUTPATIENT_CLINIC_OR_DEPARTMENT_OTHER)
Admission: EM | Admit: 2014-05-09 | Discharge: 2014-05-10 | Disposition: A | Discharge status: Home / Self Care | Payer: Medicaid Other | Attending: Emergency Medicine | Admitting: Emergency Medicine

## 2014-05-09 ENCOUNTER — Encounter (HOSPITAL_BASED_OUTPATIENT_CLINIC_OR_DEPARTMENT_OTHER): Payer: Self-pay

## 2014-05-09 DIAGNOSIS — G8929 Other chronic pain: Secondary | ICD-10-CM | POA: Insufficient documentation

## 2014-05-09 DIAGNOSIS — Z880 Allergy status to penicillin: Secondary | ICD-10-CM | POA: Insufficient documentation

## 2014-05-09 DIAGNOSIS — Z8673 Personal history of transient ischemic attack (TIA), and cerebral infarction without residual deficits: Secondary | ICD-10-CM | POA: Insufficient documentation

## 2014-05-09 DIAGNOSIS — J449 Chronic obstructive pulmonary disease, unspecified: Secondary | ICD-10-CM | POA: Diagnosis not present

## 2014-05-09 DIAGNOSIS — Z7982 Long term (current) use of aspirin: Secondary | ICD-10-CM | POA: Diagnosis not present

## 2014-05-09 DIAGNOSIS — H409 Unspecified glaucoma: Secondary | ICD-10-CM | POA: Insufficient documentation

## 2014-05-09 DIAGNOSIS — I10 Essential (primary) hypertension: Secondary | ICD-10-CM | POA: Insufficient documentation

## 2014-05-09 DIAGNOSIS — E119 Type 2 diabetes mellitus without complications: Secondary | ICD-10-CM | POA: Diagnosis not present

## 2014-05-09 DIAGNOSIS — Z79899 Other long term (current) drug therapy: Secondary | ICD-10-CM | POA: Diagnosis not present

## 2014-05-09 DIAGNOSIS — M7662 Achilles tendinitis, left leg: Secondary | ICD-10-CM | POA: Insufficient documentation

## 2014-05-09 DIAGNOSIS — E039 Hypothyroidism, unspecified: Secondary | ICD-10-CM | POA: Insufficient documentation

## 2014-05-09 DIAGNOSIS — Z720 Tobacco use: Secondary | ICD-10-CM | POA: Diagnosis not present

## 2014-05-09 DIAGNOSIS — I509 Heart failure, unspecified: Secondary | ICD-10-CM | POA: Diagnosis not present

## 2014-05-09 DIAGNOSIS — M79672 Pain in left foot: Secondary | ICD-10-CM | POA: Diagnosis present

## 2014-05-09 NOTE — ED Notes (Addendum)
Pt reports left foot pain that is worse with ambulation - onset this morning - denies known injury, denies edema, redness, denies calf pain, redness or edema. Denies paraesthesias. Pt states "im on pain management, im not here for pain medicine."

## 2014-05-10 MED ORDER — MELOXICAM 15 MG PO TABS: ORAL_TABLET | ORAL | Status: DC

## 2014-05-10 MED ORDER — NAPROXEN 250 MG PO TABS
500.0000 mg | ORAL_TABLET | Freq: Once | ORAL | Status: AC
  Administered 2014-05-10: 500 mg via ORAL
  Filled 2014-05-10: qty 2

## 2014-05-10 NOTE — ED Notes (Addendum)
C/o pain in the L achilles tendon area, denies injury, denies recent abx, CMS and ROM intact, pain worse with ambulation flexion and extension. Skin intact, no obvious redness, swelling, discoloration, denies foot or ankle pain. Taking vicodin for chronic pain, denies taking NSAIDS.

## 2014-05-10 NOTE — Discharge Instructions (Signed)
Achilles Tendinitis Achilles tendinitis is inflammation of the tough, cord-like band that attaches the lower muscles of your leg to your heel (Achilles tendon). It is usually caused by overusing the tendon and joint involved.  CAUSES Achilles tendinitis can happen because of:  A sudden increase in exercise or activity (such as running).  Doing the same exercises or activities (such as jumping) over and over.  Not warming up calf muscles before exercising.  Exercising in shoes that are worn out or not made for exercise.  Having arthritis or a bone growth on the back of the heel bone. This can rub against the tendon and hurt the tendon. SIGNS AND SYMPTOMS The most common symptoms are:  Pain in the back of the leg, just above the heel. The pain usually gets worse with exercise and better with rest.  Stiffness or soreness in the back of the leg, especially in the morning.  Swelling of the skin over the Achilles tendon.  Trouble standing on tiptoe. Sometimes, an Achilles tendon tears (ruptures). Symptoms of an Achilles tendon rupture can include:  Sudden, severe pain in the back of the leg.  Trouble putting weight on the foot or walking normally. DIAGNOSIS Achilles tendinitis will be diagnosed based on symptoms and a physical examination. An X-ray may be done to check if another condition is causing your symptoms. An MRI may be ordered if your health care provider suspects you may have completely torn your tendon, which is called an Achilles tendon rupture.  TREATMENT  Achilles tendinitis usually gets better over time. It can take weeks to months to heal completely. Treatment focuses on treating the symptoms and helping the injury heal. HOME CARE INSTRUCTIONS   Rest your Achilles tendon and avoid activities that cause pain.  Apply ice to the injured area:  Put ice in a plastic bag.  Place a towel between your skin and the bag.  Leave the ice on for 20 minutes, 2-3 times a  day  Try to avoid using the tendon (other than gentle range of motion) while the tendon is painful. Do not resume use until instructed by your health care provider. Then begin use gradually. Do not increase use to the point of pain. If pain does develop, decrease use and continue the above measures. Gradually increase activities that do not cause discomfort until you achieve normal use.  Do exercises to make your calf muscles stronger and more flexible. Your health care provider or physical therapist can recommend exercises for you to do.  Wrap your ankle with an elastic bandage or other wrap. This can help keep your tendon from moving too much. Your health care provider will show you how to wrap your ankle correctly.  Only take over-the-counter or prescription medicines for pain, discomfort, or fever as directed by your health care provider. SEEK MEDICAL CARE IF:   Your pain and swelling increase or pain is uncontrolled with medicines.  You develop new, unexplained symptoms or your symptoms get worse.  You are unable to move your toes or foot.  You develop warmth and swelling in your foot.  You have an unexplained temperature. MAKE SURE YOU:   Understand these instructions.  Will watch your condition.  Will get help right away if you are not doing well or get worse. Document Released: 10/04/2004 Document Revised: 10/15/2012 Document Reviewed: 08/06/2012 [employer] Patient Information 2015 [employer]. This information is not intended to replace advice given to you by your health care provider. Make sure you discuss   any questions you have with your health care provider.  

## 2014-05-10 NOTE — ED Provider Notes (Signed)
CSN: [id number]     Arrival date & time 05/09/14  2349 History  This chart was scribed for Paula Libra, MD by Roxy Cedar, ED Scribe. This patient was seen in room MH03/MH03 and the patient's care was started at 12:57 AM.   Chief Complaint  Patient presents with  . Foot Pain   Patient is a 54 y.o. female presenting with lower extremity pain. The history is provided by the patient. No language interpreter was used.  Foot Pain   HPI Comments: Jamie Wiggins is a 54 y.o. female with on narcotic medication for chronic pain, who presents to the Emergency Department complaining of gradually worsening left posterior heel pain onset yesterday morning. Pain began mild but gradually worsened throughout the day to the point of becoming severe. She denies trauma. Pain is exacerbated by ambulation. There is no associated functional deficit.   Past Medical History  Diagnosis Date  . Hypertension   . CHF (congestive heart failure)   . COPD (chronic obstructive pulmonary disease)   . Hypothyroidism   . Diabetes mellitus without complication   . Back pain   . TIA (transient ischemic attack)   . Glaucoma   . Chronic pain    Past Surgical History  Procedure Laterality Date  . Back surgery    . Cholecystectomy    . Abdominal hysterectomy    . 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 Tonsillectomy    . Cesarean section    . Appendectomy     History reviewed. No pertinent family history. History  Substance Use Topics  . Smoking status: Current Every Day Smoker -- 1.50 packs/day    Types: Cigarettes  . Smokeless tobacco: Never Used  . Alcohol Use: No   OB History    No data available     Review of Systems  A complete 10 system review of systems was obtained and all systems are negative except as noted in the HPI and PMH.    Allergies  Demerol and Penicillins  Home Medications   Prior to Admission medications   Medication Sig Start Date End Date Taking? Authorizing Provider   aspirin 81 MG tablet Take 81 mg by mouth daily.   Yes Historical Provider, MD  atorvastatin (LIPITOR) 80 MG tablet Take 80 mg by mouth daily.   Yes Historical Provider, MD  furosemide (LASIX) 20 MG tablet Take 20 mg by mouth daily.    Yes Historical Provider, MD  HYDROcodone-acetaminophen (NORCO) 10-325 MG per tablet Take 1 tablet by mouth every 6 (six) hours as needed. For pain     Yes Historical Provider, MD  levothyroxine (SYNTHROID, LEVOTHROID) 125 MCG tablet Take 125 mcg by mouth daily.     Yes Historical Provider, MD  lisinopril (PRINIVIL,ZESTRIL) 20 MG tablet Take 20 mg by mouth daily.     Yes Historical Provider, MD  metFORMIN (GLUCOPHAGE) 500 MG tablet Take 500 mg by mouth daily with breakfast.   Yes Historical Provider, MD  TiZANidine HCl (ZANAFLEX PO) Take by mouth.   Yes Historical Provider, MD  albuterol (PROVENTIL HFA;VENTOLIN HFA) 108 (90 BASE) MCG/ACT inhaler Inhale 2 puffs into the lungs every 6 (six) hours as needed for wheezing. 12/09/10 12/09/11  Dione Booze, MD  azithromycin (ZITHROMAX Z-PAK) 250 MG tablet 2 po day one, then 1 daily x 4 days 01/11/12   Arthor Captain, PA-C  chlorpheniramine-HYDROcodone [facility]) 10-8 MG/5ML LQCR Take 5 mLs by mouth every 12 (twelve) hours as needed. 12/09/10   Onalee Hua  Preston FleetingGlick, MD  chlorpheniramine-HYDROcodone [facility](TUSSIONEX PENNKINETIC ER) 10-8 MG/5ML LQCR Take 5 mLs by mouth every 12 (twelve) hours as needed. 01/11/12   Arthor CaptainAbigail Harris, PA-C  cyclobenzaprine (FLEXERIL) 10 MG tablet Take 10 mg by mouth 3 (three) times daily as needed for muscle spasms.    Historical Provider, MD  HYDROmorphone (DILAUDID) 4 MG tablet Take 1 tablet (4 mg total) by mouth every 4 (four) hours as needed for pain. 10/25/12   Wayland SalinasJohn Bednar, MD  ibuprofen (ADVIL,MOTRIN) 600 MG tablet Take 1 tablet (600 mg total) by mouth every 6 (six) hours as needed for pain. 10/05/12   Loren Raceravid Yelverton, MD  LORazepam (ATIVAN) 1 MG tablet Take 1 tablet (1 mg total) by mouth every 6 (six)  hours as needed (spasms). 10/25/12   Wayland SalinasJohn Bednar, MD  simvastatin (ZOCOR) 20 MG tablet Take 20 mg by mouth daily.     Historical Provider, MD   Triage Vitals: BP 148/78 mmHg  Pulse 95  Temp(Src) 98.4 F (36.9 C) (Oral)  Resp 20  Ht 5\' 1"  (1.549 m)  Wt 188 lb (85.276 kg)  BMI 35.54 kg/m2  SpO2 100%  Physical Exam  General: Well-developed, well-nourished female in no acute distress; appearance consistent with age of record HENT: normocephalic; atraumatic Eyes: pupils equal, round and reactive to light; extraocular muscles intact Neck: supple Heart: regular rate and rhythm Lungs: clear to auscultation bilaterally Abdomen: soft; nondistended; nontender; bowel sounds present Extremities: No deformity; full range of motion except left ankle limited by pain; pulses normal; tenderness of left achilles tendon without erythema or warmth, no evidence of rupture Neurologic: Awake, alert and oriented; motor function intact in all extremities and symmetric; no facial droop Skin: Warm and dry Psychiatric: Normal mood and affect   ED Course  Procedures (including critical care time)  DIAGNOSTIC STUDIES: Oxygen Saturation is 100% on RA, normal by my interpretation.    COORDINATION OF CARE: 1:01 AM- Discussed plans to discharge patient with crutches. Pt advised of plan for treatment and pt agrees.  MDM  I personally performed the services described in this documentation, which was scribed in my presence. The recorded information has been reviewed and is accurate.   Paula LibraJohn Trenika Hudson, MD 05/10/14 0110

## 2014-05-10 NOTE — ED Notes (Signed)
NSAID given, crutches in hand, ASO in place, denies needs or questions, given Rx x1 and ice pack. Family present with pt.

## 2014-11-20 ENCOUNTER — Emergency Department (HOSPITAL_BASED_OUTPATIENT_CLINIC_OR_DEPARTMENT_OTHER)
Admission: EM | Admit: 2014-11-20 | Discharge: 2014-11-20 | Disposition: A | Discharge status: Home / Self Care | Payer: Medicaid Other | Attending: Emergency Medicine | Admitting: Emergency Medicine

## 2014-11-20 ENCOUNTER — Emergency Department (HOSPITAL_BASED_OUTPATIENT_CLINIC_OR_DEPARTMENT_OTHER): Payer: Medicaid Other

## 2014-11-20 ENCOUNTER — Encounter (HOSPITAL_BASED_OUTPATIENT_CLINIC_OR_DEPARTMENT_OTHER): Payer: Self-pay | Admitting: *Deleted

## 2014-11-20 DIAGNOSIS — Z79899 Other long term (current) drug therapy: Secondary | ICD-10-CM | POA: Insufficient documentation

## 2014-11-20 DIAGNOSIS — Z8673 Personal history of transient ischemic attack (TIA), and cerebral infarction without residual deficits: Secondary | ICD-10-CM | POA: Insufficient documentation

## 2014-11-20 DIAGNOSIS — Z720 Tobacco use: Secondary | ICD-10-CM | POA: Diagnosis not present

## 2014-11-20 DIAGNOSIS — Z7982 Long term (current) use of aspirin: Secondary | ICD-10-CM | POA: Diagnosis not present

## 2014-11-20 DIAGNOSIS — Z8669 Personal history of other diseases of the nervous system and sense organs: Secondary | ICD-10-CM | POA: Diagnosis not present

## 2014-11-20 DIAGNOSIS — I10 Essential (primary) hypertension: Secondary | ICD-10-CM | POA: Insufficient documentation

## 2014-11-20 DIAGNOSIS — E119 Type 2 diabetes mellitus without complications: Secondary | ICD-10-CM | POA: Diagnosis not present

## 2014-11-20 DIAGNOSIS — Z880 Allergy status to penicillin: Secondary | ICD-10-CM | POA: Insufficient documentation

## 2014-11-20 DIAGNOSIS — E039 Hypothyroidism, unspecified: Secondary | ICD-10-CM | POA: Insufficient documentation

## 2014-11-20 DIAGNOSIS — M25512 Pain in left shoulder: Secondary | ICD-10-CM | POA: Diagnosis not present

## 2014-11-20 DIAGNOSIS — J449 Chronic obstructive pulmonary disease, unspecified: Secondary | ICD-10-CM | POA: Insufficient documentation

## 2014-11-20 DIAGNOSIS — G8929 Other chronic pain: Secondary | ICD-10-CM | POA: Diagnosis not present

## 2014-11-20 DIAGNOSIS — I509 Heart failure, unspecified: Secondary | ICD-10-CM | POA: Diagnosis not present

## 2014-11-20 MED ORDER — MELOXICAM 15 MG PO TABS: ORAL_TABLET | ORAL | Status: AC

## 2014-11-20 MED ORDER — NAPROXEN 250 MG PO TABS
500.0000 mg | ORAL_TABLET | Freq: Once | ORAL | Status: AC
  Administered 2014-11-20: 500 mg via ORAL
  Filled 2014-11-20: qty 2

## 2014-11-20 NOTE — ED Notes (Signed)
C/o left shoulder pain x months,  Is being treated for same but pain meds are not working,  Suppose to have mri in 1 week

## 2014-11-20 NOTE — ED Notes (Signed)
C/o left shoulder pain x months,  And tingling, is being treated for same,  Goes to pain management clinic,  Pt is getting worse  Not relieved by pain meds

## 2014-11-20 NOTE — ED Provider Notes (Addendum)
CSN: [id number]     Arrival date & time 11/20/14  0103 History   None    Chief Complaint  Patient presents with  . Shoulder Pain     (Consider location/radiation/quality/duration/timing/severity/associated sxs/prior Treatment) HPI  This is a 54 year old female with a 1-1/2 month history of pain in the left shoulder radiating down to her left arm and hand. She is on chronic narcotics through a pain clinic. She is currently on oxycodone/APAP 10/325 6 tablets a day. She has been told her pain is likely due to cervical radiculopathy and she has an MRI of the neck pending. She is here with worsening pain and about the C6 and C7 dermatomes. She is localizing her pain now to her left acromioclavicular joint, worse with palpation or movement. She is wondering if her pain is actually originating in her shoulder rather than her cervical spine.  Past Medical History  Diagnosis Date  . Hypertension   . CHF (congestive heart failure) (HCC)   . COPD (chronic obstructive pulmonary disease) (HCC)   . Hypothyroidism   . Diabetes mellitus without complication (HCC)   . Back pain   . TIA (transient ischemic attack)   . Glaucoma   . Chronic pain    Past Surgical History  Procedure Laterality Date  . Back surgery    . Cholecystectomy    . Abdominal hysterectomy    . 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 Tonsillectomy    . Cesarean section    . Appendectomy     No family history on file. Social History  Substance Use Topics  . Smoking status: Current Every Day Smoker -- 1.50 packs/day    Types: Cigarettes  . Smokeless tobacco: Never Used  . Alcohol Use: No   OB History    No data available     Review of Systems  All other systems reviewed and are negative.   Allergies  Demerol and Penicillins  Home Medications   Prior to Admission medications   Medication Sig Start Date End Date Taking? Authorizing Provider  aspirin 81 MG tablet Take 81 mg by mouth daily.    Historical  Provider, MD  atorvastatin (LIPITOR) 80 MG tablet Take 80 mg by mouth daily.    Historical Provider, MD  furosemide (LASIX) 20 MG tablet Take 20 mg by mouth daily.     Historical Provider, MD  HYDROcodone-acetaminophen (NORCO) 10-325 MG per tablet Take 1 tablet by mouth every 6 (six) hours as needed. For pain      Historical Provider, MD  levothyroxine (SYNTHROID, LEVOTHROID) 125 MCG tablet Take 125 mcg by mouth daily.      Historical Provider, MD  lisinopril (PRINIVIL,ZESTRIL) 20 MG tablet Take 20 mg by mouth daily.      Historical Provider, MD  meloxicam (MOBIC) 15 MG tablet Take 1/2-1 tablet daily as needed for tendinitis. 05/10/14   Topaz Raglin, MD  metFORMIN (GLUCOPHAGE) 500 MG tablet Take 500 mg by mouth daily with breakfast.    Historical Provider, MD  TiZANidine HCl (ZANAFLEX PO) Take by mouth.    Historical Provider, MD   BP 110/68 mmHg  Pulse 86  Temp(Src) 98.7 F (37.1 C) (Oral)  Resp 20  Ht  (1.549 m)  Wt 180 lb (81.647 kg)  BMI 34.03 kg/m2  SpO2 95%   Physical Exam  General: Well-developed, well-nourished female in no acute distress; appearance consistent with age of record HENT: normocephalic; atraumatic Eyes: pupils equal, round and reactive to light; extraocular  muscles intact Neck: supple; full range of motion without significant change in the patient's pain Heart: regular rate and rhythm Lungs: clear to auscultation bilaterally Abdomen: soft; nondistended Extremities: No deformity; full range of motion; pulses normal; tenderness of left acromioclavicular joint and surrounding soft tissue without significant loss of range of motion of the left shoulder Neurologic: Awake, alert and oriented; motor function intact in all extremities and symmetric; no facial droop Skin: Warm and dry Psychiatric: Normal mood and affect    ED Course  Procedures (including critical care time)   MDM  Nursing notes and vitals signs, including pulse oximetry, reviewed.  Summary  of this visit's results, reviewed by myself:  Imaging Studies: Dg Shoulder Left  11/20/2014  CLINICAL DATA:  Chronic left posterior shoulder pain for 1 1/2 months. Initial encounter. EXAM: LEFT SHOULDER - 2+ VIEW COMPARISON:  None. FINDINGS: There is no evidence of fracture or dislocation. The left humeral head is seated within the glenoid fossa. Mild degenerative change is noted at the left glenoid. The acromioclavicular joint is unremarkable in appearance. No significant soft tissue abnormalities are seen. The visualized portions of the left lung are clear. IMPRESSION: No evidence of fracture or dislocation. Mild degenerative change noted at the left glenoid fossa. Electronically Signed   By: Roanna RaiderJeffery  Chang M.D.   On: 11/20/2014 01:50   1:56 AM The patient does not wish any additional narcotics. She was up front and honest about her chronic pain contract. We will add Mobic.   Paula LibraJohn Aleem Elza, MD 11/20/14 [id number]  Paula LibraJohn Mikeisha Lemonds, MD 11/20/14 [phone]

## 2017-07-12 IMAGING — CR DG SHOULDER 2+V*L*
3 series · 3 of 3 positions shown · non-contrast
Comparison: None.

CLINICAL DATA: Chronic left posterior shoulder pain for 1 [DATE]
months. Initial encounter.

EXAM:
LEFT SHOULDER - 2+ VIEW

[w shoulder grashey left]
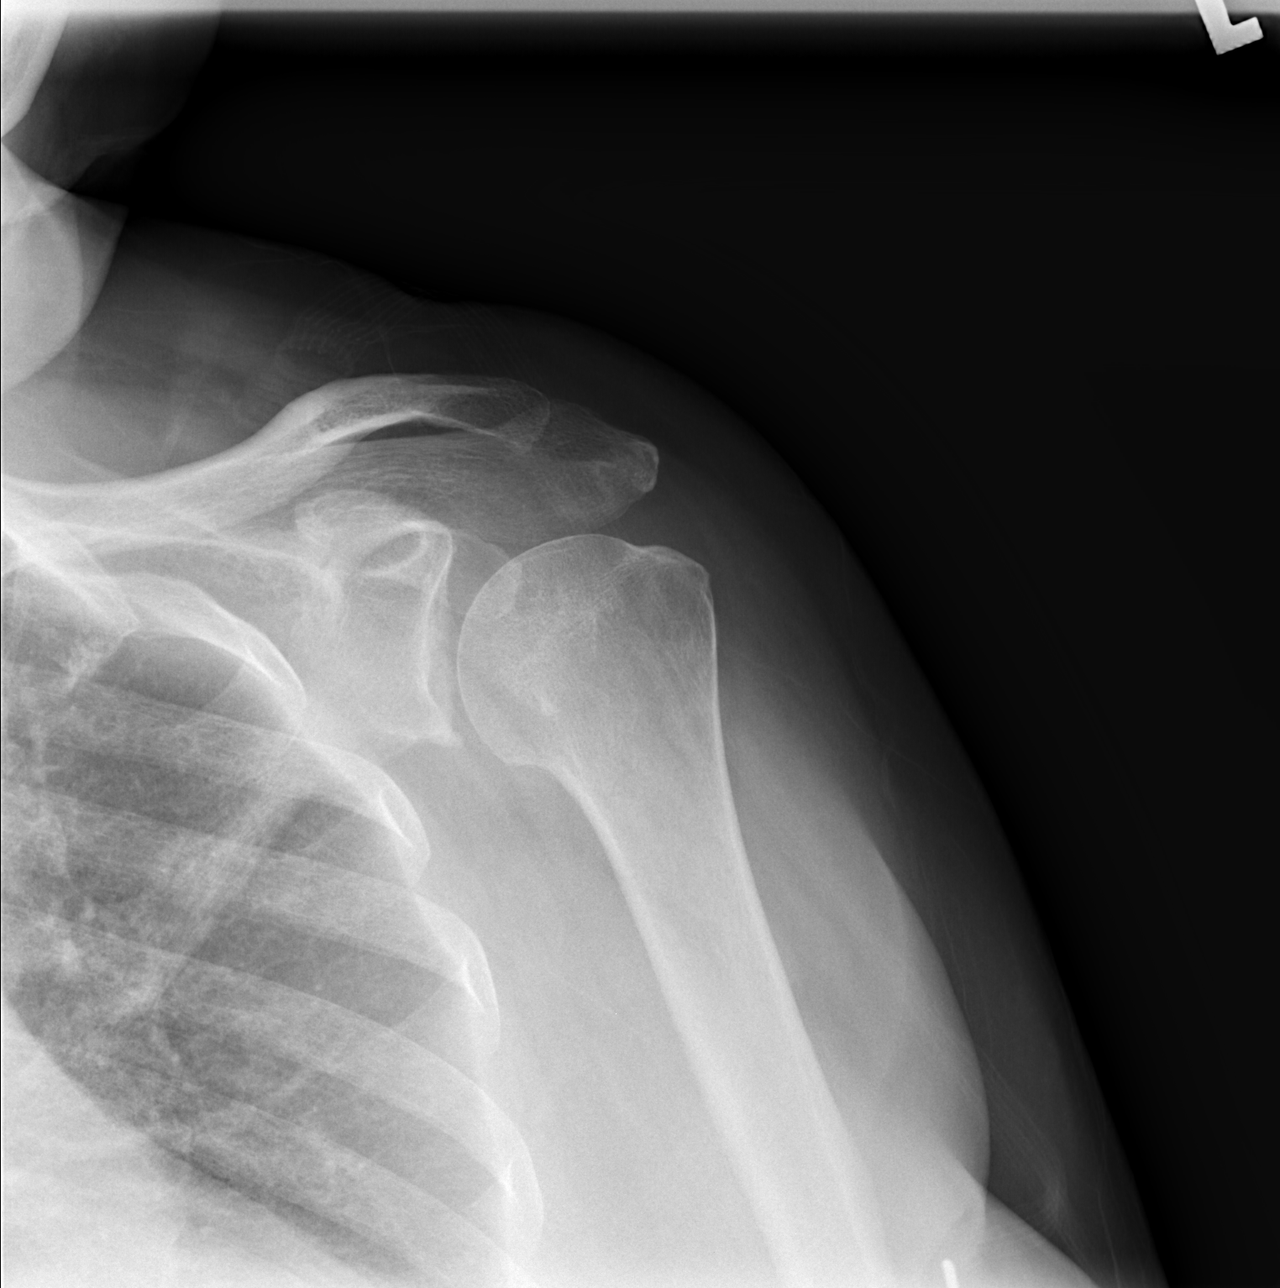

[w shoulder y view left]
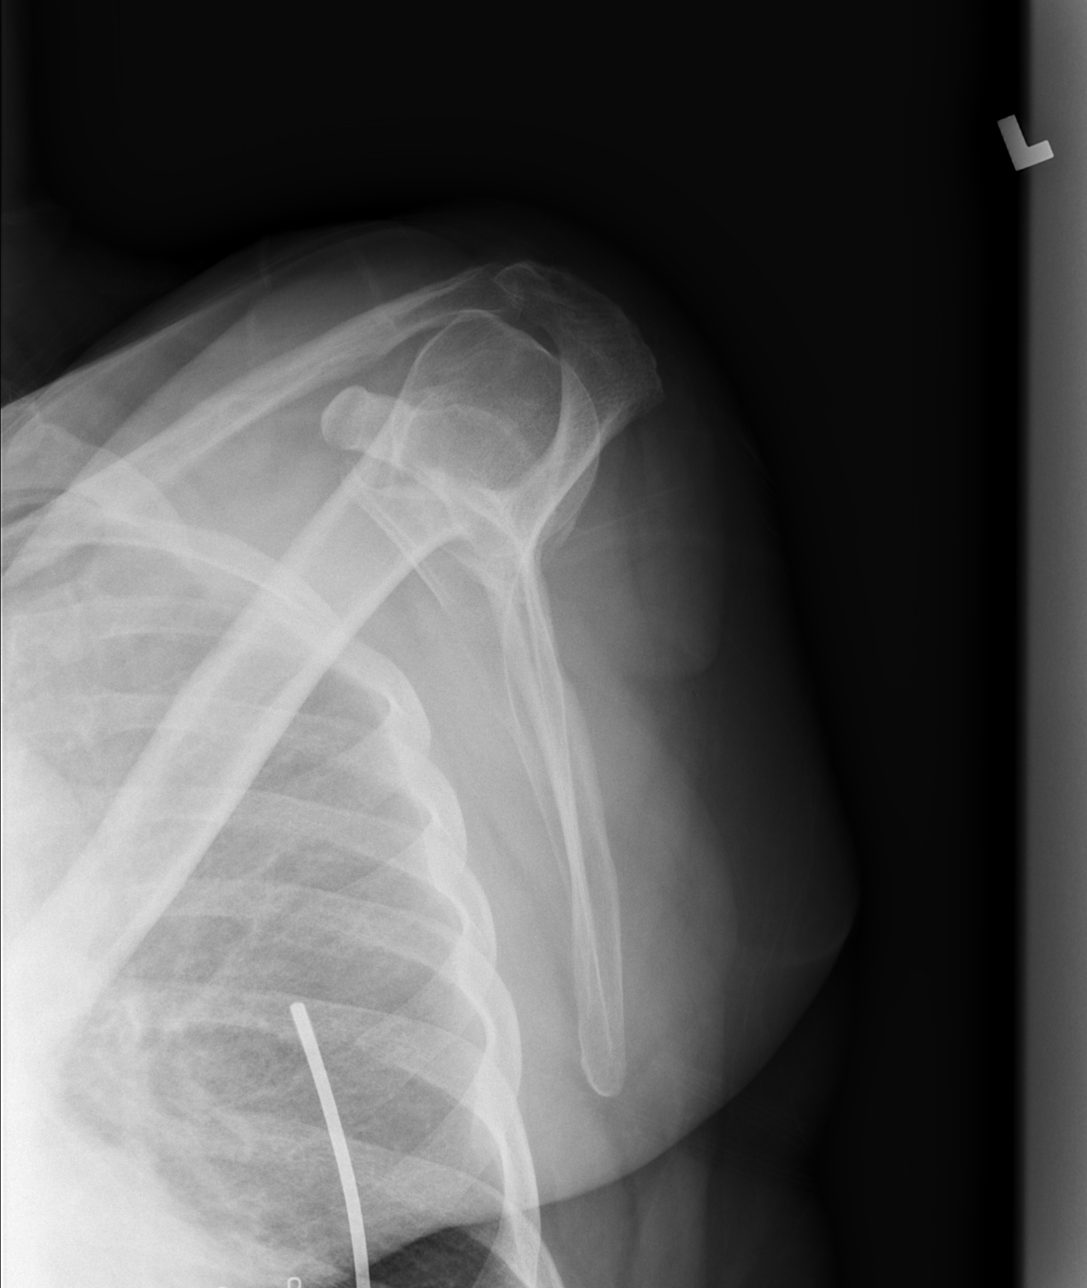

[x shoulder axillary left]
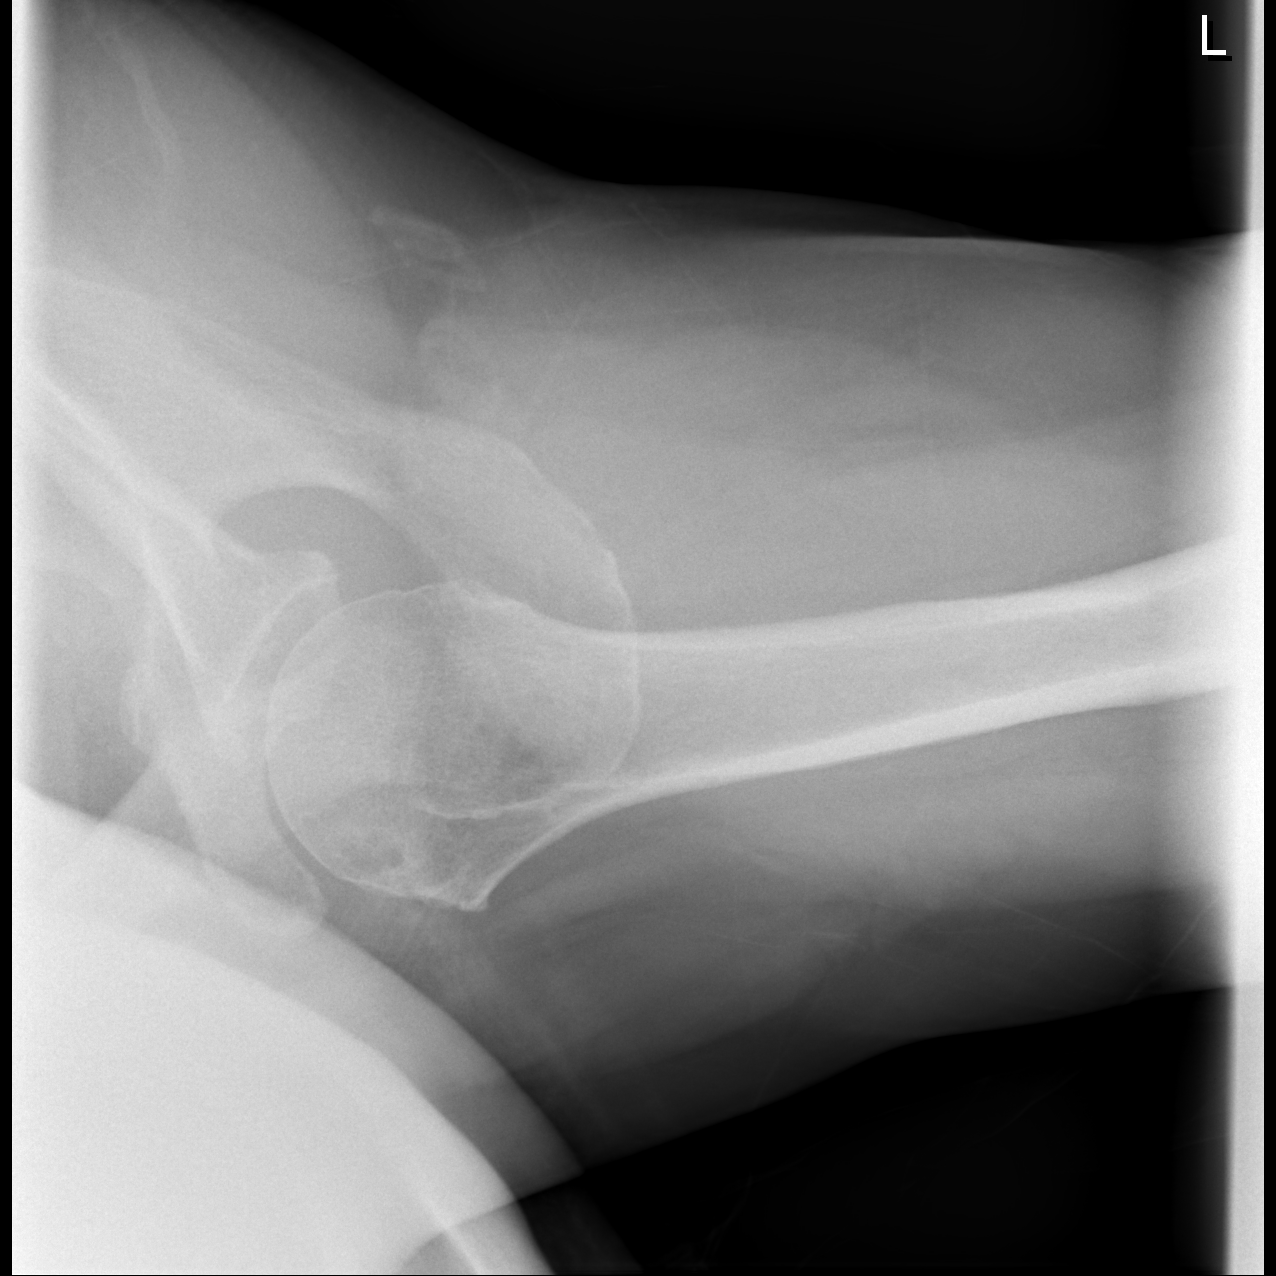

[3 of 3 positions shown; findings below may reference images not displayed]

FINDINGS: There is no evidence of fracture or dislocation. The left humeral
head is seated within the glenoid fossa. Mild degenerative change is
noted at the left glenoid. The acromioclavicular joint is
unremarkable in appearance. No significant soft tissue abnormalities
are seen. The visualized portions of the left lung are clear.
IMPRESSION: No evidence of fracture or dislocation. Mild degenerative change
noted at the left glenoid fossa.

## 2021-01-30 ENCOUNTER — Emergency Department (HOSPITAL_BASED_OUTPATIENT_CLINIC_OR_DEPARTMENT_OTHER)
Admission: EM | Admit: 2021-01-30 | Discharge: 2021-01-30 | Disposition: A | Discharge status: Home / Self Care | Payer: BC Managed Care – PPO | Attending: Emergency Medicine | Admitting: Emergency Medicine

## 2021-01-30 ENCOUNTER — Other Ambulatory Visit: Payer: Self-pay

## 2021-01-30 ENCOUNTER — Encounter (HOSPITAL_BASED_OUTPATIENT_CLINIC_OR_DEPARTMENT_OTHER): Payer: Self-pay | Admitting: *Deleted

## 2021-01-30 DIAGNOSIS — Z79899 Other long term (current) drug therapy: Secondary | ICD-10-CM | POA: Diagnosis not present

## 2021-01-30 DIAGNOSIS — H5713 Ocular pain, bilateral: Secondary | ICD-10-CM | POA: Diagnosis present

## 2021-01-30 DIAGNOSIS — Z7984 Long term (current) use of oral hypoglycemic drugs: Secondary | ICD-10-CM | POA: Diagnosis not present

## 2021-01-30 DIAGNOSIS — Z7982 Long term (current) use of aspirin: Secondary | ICD-10-CM | POA: Diagnosis not present

## 2021-01-30 DIAGNOSIS — H1033 Unspecified acute conjunctivitis, bilateral: Secondary | ICD-10-CM | POA: Diagnosis not present

## 2021-01-30 MED ORDER — ERYTHROMYCIN 5 MG/GM OP OINT: TOPICAL_OINTMENT | OPHTHALMIC | 0 refills | Status: AC

## 2021-01-30 MED ORDER — FLUORESCEIN SODIUM 1 MG OP STRP
2.0000 | ORAL_STRIP | Freq: Once | OPHTHALMIC | Status: AC
Start: 2021-01-30 — End: 2021-01-30
  Administered 2021-01-30: 2 via OPHTHALMIC
  Filled 2021-01-30: qty 2

## 2021-01-30 MED ORDER — TETRACAINE HCL 0.5 % OP SOLN
2.0000 [drp] | Freq: Once | OPHTHALMIC | Status: AC
  Administered 2021-01-30: 2 [drp] via OPHTHALMIC
  Filled 2021-01-30: qty 4

## 2021-01-30 NOTE — ED Provider Notes (Signed)
[facility] EMERGENCY DEPARTMENT Provider Note   CSN: [id number] Arrival date & time: 01/30/21  1814     History  Chief Complaint  Patient presents with   Eye Problem    Jamie Wiggins is a 61 y.o. female presenting for evaluation of bilateral eye pain and irritation.  Patient states for the past week she has had irritation of her left eye.  Today it became both.  She states she wakes up every morning with her eyes crusted shut.  She is now having increasing pain at the lid and eye of the left side.  She does not wear glasses or contacts.  She has not used any eyedrops.  She has an eye doctor, but has not been able to see them as her vision insurance is no longer covered.    HPI     Home Medications Prior to Admission medications   Medication Sig Start Date End Date Taking? Authorizing Provider  aspirin 81 MG tablet Take 81 mg by mouth daily.   Yes [provider]  atorvastatin (LIPITOR) 80 MG tablet Take 80 mg by mouth daily.   Yes [provider]  erythromycin ophthalmic ointment Place a 1/2 inch ribbon of ointment into the lower eyelid for 5 days 01/30/21  Yes Iris Hairston, PA-C  furosemide (LASIX) 20 MG tablet Take 20 mg by mouth daily.    Yes [provider]  HYDROcodone-acetaminophen (NORCO) 10-325 MG per tablet Take 1 tablet by mouth every 6 (six) hours as needed. For pain     Yes [provider]  levothyroxine (SYNTHROID, LEVOTHROID) 125 MCG tablet Take 125 mcg by mouth daily.     Yes [provider]  lisinopril (PRINIVIL,ZESTRIL) 20 MG tablet Take 20 mg by mouth daily.     Yes [provider]  metFORMIN (GLUCOPHAGE) 500 MG tablet Take 500 mg by mouth daily with breakfast.   Yes [provider]  meloxicam (MOBIC) 15 MG tablet Take 1/2-1 tablet daily as needed for shoulder pain. 11/20/14   Molpus, John, MD  TiZANidine HCl (ZANAFLEX PO) Take by mouth.    [provider]  albuterol  (PROVENTIL HFA;VENTOLIN HFA) 108 (90 BASE) MCG/ACT inhaler Inhale 2 puffs into the lungs every 6 (six) hours as needed for wheezing. [id number]  Delora Fuel, MD  simvastatin (ZOCOR) 20 MG tablet Take 20 mg by mouth daily.   05/10/14  [provider]      Allergies    Demerol and Penicillins    Review of Systems   Review of Systems  Eyes:  Positive for pain and redness.   Physical Exam Updated Vital Signs BP 140/74 (BP Location: Right Arm)    Pulse 62    Temp 98.2 F (36.8 C) (Oral)    Resp 18    Ht 5\' 1"  (1.549 m)    Wt 72.6 kg    SpO2 95%    BMI 30.23 kg/m  Physical Exam Vitals and nursing note reviewed.  Constitutional:      General: She is not in acute distress.    Appearance: She is well-developed.  HENT:     Head: Normocephalic and atraumatic.  Eyes:     Extraocular Movements: Extraocular movements intact.     Conjunctiva/sclera:     Right eye: Right conjunctiva is injected. No chemosis.    Left eye: Left conjunctiva is injected. No chemosis.    Pupils: Pupils are equal, round, and reactive to light.     Right eye:  No corneal abrasion or fluorescein uptake.     Left eye: No corneal abrasion.     Comments: Left upper lid swollen, tender, erythematous.  Mild conjunctival injection bilaterally. No fluorescein uptake of either eye.  No abrasions or ulcerations noted.  Cardiovascular:     Rate and Rhythm: Normal rate.  Pulmonary:     Effort: Pulmonary effort is normal.  Abdominal:     General: There is no distension.  Musculoskeletal:        General: Normal range of motion.     Cervical back: Normal range of motion.  Skin:    General: Skin is warm.     Findings: No rash.  Neurological:     Mental Status: She is alert and oriented to person, place, and time.    ED Results / Procedures / Treatments   Labs (all labs ordered are listed, but only abnormal results are displayed) Labs Reviewed - No data to display  EKG None  Radiology No results  found.  Procedures Procedures    Medications Ordered in ED Medications  fluorescein ophthalmic strip 2 strip (2 strips Both Eyes Given 01/30/21 1942)  tetracaine (PONTOCAINE) 0.5 % ophthalmic solution 2 drop (2 drops Both Eyes Given 01/30/21 1942)    ED Course/ Medical Decision Making/ A&P                           Medical Decision Making Risk Prescription drug management.    This patient presents to the ED for concern of eye pain and irritation.  This involves a number of treatment options, and is a complaint that carries with it a moderate risk of complications and morbidity.  The differential diagnosis includes conjunctivitis, corneal abrasion, dry eye, blepharitis, stye, preseptal cellulitis  Test Considered:  as pain is bilateral, and more in the lids than the eye, not c/w acute angle glaucoma. Pt does not need IOP testing.    Interventions:  Eye exam/Wood lamp exam without fluorescein uptake or signs of corneal abrasion/ulceration   Disposition:  After consideration of the diagnostic results and the patients response to treatment, I feel that the patent would benefit from outpatient management with erythromycin ointment and use of warm compresses.  Encourage close follow-up with with ophthalmology, resources given.  At this time, patient appears safe for discharge.  Return precautions given.  Patient states she understands and agrees to plan Final Clinical Impression(s) / ED Diagnoses Final diagnoses:  Acute conjunctivitis of both eyes, unspecified acute conjunctivitis type    Rx / DC Orders ED Discharge Orders          Ordered    erythromycin ophthalmic ointment        01/30/21 2016              Kaine Mcquillen, PA-C 01/30/21 2107    Lennice Sites, DO 01/30/21 2229

## 2021-01-30 NOTE — Discharge Instructions (Signed)
Use the antibiotic ointment twice a day for the next 5 days. Use a warm compress, 20 minutes at a time, 3 times a day to help with pain and inflammation. Otherwise, try not to rub at or irritate your eyes. Follow-up with the ophthalmologist listed below if your symptoms not proving. Return to the emergency room if you have severe worsening pain, vision loss, or any new, worsening, or concerning symptoms.

## 2021-01-30 NOTE — ED Triage Notes (Signed)
Both eyes have been tearing for a week. At first they felt like sand was in her eyes. Now her eyelids are painful to touch.

## 2021-01-30 NOTE — ED Notes (Signed)
Pt do not wears glasses.

## 2022-06-09 ENCOUNTER — Other Ambulatory Visit: Payer: Self-pay

## 2022-06-09 ENCOUNTER — Encounter (HOSPITAL_BASED_OUTPATIENT_CLINIC_OR_DEPARTMENT_OTHER): Payer: Self-pay | Admitting: Emergency Medicine

## 2022-06-09 ENCOUNTER — Emergency Department (HOSPITAL_BASED_OUTPATIENT_CLINIC_OR_DEPARTMENT_OTHER)
Admission: EM | Admit: 2022-06-09 | Discharge: 2022-06-09 | Disposition: A | Discharge status: Home / Self Care | Payer: Commercial Managed Care - HMO | Attending: Emergency Medicine | Admitting: Emergency Medicine

## 2022-06-09 ENCOUNTER — Emergency Department (HOSPITAL_BASED_OUTPATIENT_CLINIC_OR_DEPARTMENT_OTHER): Payer: Commercial Managed Care - HMO

## 2022-06-09 DIAGNOSIS — S60222A Contusion of left hand, initial encounter: Secondary | ICD-10-CM | POA: Insufficient documentation

## 2022-06-09 DIAGNOSIS — W228XXA Striking against or struck by other objects, initial encounter: Secondary | ICD-10-CM | POA: Diagnosis not present

## 2022-06-09 DIAGNOSIS — M79642 Pain in left hand: Secondary | ICD-10-CM

## 2022-06-09 MED ORDER — CELECOXIB 200 MG PO CAPS: 200.0000 mg | ORAL_CAPSULE | Freq: Two times a day (BID) | ORAL | 0 refills | Status: AC

## 2022-06-09 NOTE — ED Triage Notes (Signed)
Pt c/o LT hand pain after hitting it on kitchen counter today

## 2022-06-09 NOTE — ED Provider Notes (Signed)
[facility] AT [facility] [place] Provider Note   CSN: [id number] Arrival date & time: 06/09/22  2206     History Chief Complaint  Patient presents with  . Hand Pain    HPI Jamie Wiggins is a 62 y.o. female presenting for chief complaint of left hand pain.  States that she accidentally hit a countertop that she was walking by.  Immediately had pain and shooting discomfort into her left thumb.  Is right-hand dominant.  Denies fevers chills nausea vomiting shortness of breath is not on anticoagulation does take aspirin.  Has a hematoma on her anterior palm over the site that she hit.   Patient's recorded medical, surgical, social, medication list and allergies were reviewed in the Snapshot window as part of the initial history.   Review of Systems   Review of Systems  Constitutional:  Negative for chills and fever.  HENT:  Negative for ear pain and sore throat.   Eyes:  Negative for pain and visual disturbance.  Respiratory:  Negative for cough and shortness of breath.   Cardiovascular:  Negative for chest pain and palpitations.  Gastrointestinal:  Negative for abdominal pain and vomiting.  Genitourinary:  Negative for dysuria and hematuria.  Musculoskeletal:  Negative for arthralgias and back pain.  Skin:  Negative for color change and rash.  Neurological:  Negative for seizures and syncope.  All other systems reviewed and are negative.   Physical Exam Updated Vital Signs BP 131/78   Pulse 74   Temp (!) 97.3 F (36.3 C)   Resp 20   Ht 5\' 1"  (1.549 m)   Wt 74.8 kg   SpO2 93%   BMI 31.18 kg/m  Physical Exam Vitals and nursing note reviewed.  Constitutional:      General: She is not in acute distress.    Appearance: She is well-developed.  HENT:     Head: Normocephalic and atraumatic.  Eyes:     Conjunctiva/sclera: Conjunctivae normal.  Cardiovascular:     Rate and Rhythm: Normal rate and regular rhythm.     Heart sounds: No murmur  heard. Pulmonary:     Effort: Pulmonary effort is normal. No respiratory distress.     Breath sounds: Normal breath sounds.  Abdominal:     General: There is no distension.     Palpations: Abdomen is soft.     Tenderness: There is no abdominal tenderness. There is no right CVA tenderness or left CVA tenderness.  Musculoskeletal:        General: No swelling or tenderness. Normal range of motion.     Cervical back: Neck supple.     Comments: Bruising on the left palm.  Shooting pain with any motion of the left wrist.  Skin:    General: Skin is warm and dry.  Neurological:     General: No focal deficit present.     Mental Status: She is alert and oriented to person, place, and time. Mental status is at baseline.     Cranial Nerves: No cranial nerve deficit.     ED Course/ Medical Decision Making/ A&P    Procedures Procedures   Medications Ordered in ED Medications - No data to display  Medical Decision Making:   Ms. Michener is presenting with a chief complaint of left hand pain after minor trauma.  Possible hematoma causing pressure sensation and discomfort.  X-ray performed to evaluate for underlying fracture with no focal pathology detected. Will place patient into a thumb spica splint  for comfort and recommend follow-up with orthopedics for further care management.  Supportive care with her home pain medication and anti-inflammatories as needed.   Clinical Impression:  1. Left hand pain      Data Unavailable   Final Clinical Impression(s) / ED Diagnoses Final diagnoses:  Left hand pain    Rx / DC Orders ED Discharge Orders          Ordered    celecoxib (CELEBREX) 200 MG capsule  2 times daily        06/09/22 2223              Glyn Ade, MD 06/09/22 2323

## 2022-07-23 ENCOUNTER — Other Ambulatory Visit: Payer: Self-pay

## 2022-07-23 ENCOUNTER — Emergency Department (HOSPITAL_BASED_OUTPATIENT_CLINIC_OR_DEPARTMENT_OTHER): Payer: Commercial Managed Care - HMO

## 2022-07-23 ENCOUNTER — Emergency Department (HOSPITAL_BASED_OUTPATIENT_CLINIC_OR_DEPARTMENT_OTHER)
Admission: EM | Admit: 2022-07-23 | Discharge: 2022-07-23 | Disposition: A | Discharge status: Home / Self Care | Payer: Commercial Managed Care - HMO | Attending: Emergency Medicine | Admitting: Emergency Medicine

## 2022-07-23 DIAGNOSIS — Z7982 Long term (current) use of aspirin: Secondary | ICD-10-CM | POA: Insufficient documentation

## 2022-07-23 DIAGNOSIS — J449 Chronic obstructive pulmonary disease, unspecified: Secondary | ICD-10-CM | POA: Insufficient documentation

## 2022-07-23 DIAGNOSIS — R531 Weakness: Secondary | ICD-10-CM | POA: Diagnosis not present

## 2022-07-23 DIAGNOSIS — Z7984 Long term (current) use of oral hypoglycemic drugs: Secondary | ICD-10-CM | POA: Insufficient documentation

## 2022-07-23 DIAGNOSIS — R079 Chest pain, unspecified: Secondary | ICD-10-CM | POA: Insufficient documentation

## 2022-07-23 DIAGNOSIS — R0602 Shortness of breath: Secondary | ICD-10-CM | POA: Diagnosis not present

## 2022-07-23 DIAGNOSIS — Z79899 Other long term (current) drug therapy: Secondary | ICD-10-CM | POA: Insufficient documentation

## 2022-07-23 DIAGNOSIS — I509 Heart failure, unspecified: Secondary | ICD-10-CM | POA: Diagnosis not present

## 2022-07-23 DIAGNOSIS — E119 Type 2 diabetes mellitus without complications: Secondary | ICD-10-CM | POA: Insufficient documentation

## 2022-07-23 LAB — CBC WITH DIFFERENTIAL/PLATELET
Abs Immature Granulocytes: 0.04 10*3/uL (ref 0.00–0.07)
Basophils Absolute: 0.1 10*3/uL (ref 0.0–0.1)
Basophils Relative: 1 %
Eosinophils Absolute: 0.1 10*3/uL (ref 0.0–0.5)
Eosinophils Relative: 2 %
HCT: 41.3 % (ref 36.0–46.0)
Hemoglobin: 14.1 g/dL (ref 12.0–15.0)
Immature Granulocytes: 1 %
Lymphocytes Relative: 28 %
Lymphs Abs: 2.3 10*3/uL (ref 0.7–4.0)
MCH: 31.2 pg (ref 26.0–34.0)
MCHC: 34.1 g/dL (ref 30.0–36.0)
MCV: 91.4 fL (ref 80.0–100.0)
Monocytes Absolute: 0.7 10*3/uL (ref 0.1–1.0)
Monocytes Relative: 8 %
Neutro Abs: 5.1 10*3/uL (ref 1.7–7.7)
Neutrophils Relative %: 60 %
Platelets: 194 10*3/uL (ref 150–400)
RBC: 4.52 MIL/uL (ref 3.87–5.11)
RDW: 13.4 % (ref 11.5–15.5)
WBC: 8.3 10*3/uL (ref 4.0–10.5)
nRBC: 0 % (ref 0.0–0.2)

## 2022-07-23 LAB — BASIC METABOLIC PANEL
Anion gap: 8 (ref 5–15)
BUN: 15 mg/dL (ref 8–23)
CO2: 26 mmol/L (ref 22–32)
Calcium: 8.5 mg/dL — ABNORMAL LOW (ref 8.9–10.3)
Chloride: 101 mmol/L (ref 98–111)
Creatinine, Ser: 1.05 mg/dL — ABNORMAL HIGH (ref 0.44–1.00)
GFR, Estimated: >60 mL/min (ref >60)
Glucose, Bld: 123 mg/dL — ABNORMAL HIGH (ref 70–99)
Potassium: 3.9 mmol/L (ref 3.5–5.1)
Sodium: 135 mmol/L (ref 135–145)

## 2022-07-23 LAB — BRAIN NATRIURETIC PEPTIDE: B Natriuretic Peptide: 58.8 pg/mL (ref 0.0–100.0)

## 2022-07-23 LAB — TROPONIN I (HIGH SENSITIVITY): Troponin I (High Sensitivity): 3 ng/L (ref <18)

## 2022-07-23 MED ORDER — ALBUTEROL SULFATE HFA 108 (90 BASE) MCG/ACT IN AERS: 2.0000 | INHALATION_SPRAY | RESPIRATORY_TRACT | Status: DC | PRN

## 2022-07-23 NOTE — ED Provider Notes (Signed)
[facility]GENCY DEPARTMENT AT [facility] [place] Provider Note   CSN: [id number] Arrival date & time: 07/23/22  1303     History  Chief Complaint  Patient presents with   Shortness of Breath   HPI Jamie Wiggins is a 62 y.o. female with COPD, CHF, diabetes and history of TIA presenting for shortness of breath and weakness.  States generalized weakness Jamie Wiggins on all of a sudden around 11:30 AM today.  This was followed by shortness of breath.  States she had some mild chest pain but does not have chest pain now.  States she was running errands today and was in the sun a good bit, wonder if she is dehydrated.  States overall that her symptoms have improved.  Endorses associated nausea but no vomiting, diarrhea, cough or fever.  Denies calf tenderness, lower extremity edema or recent weight gain.  Smokes 1 pack of cigarettes a day.   Shortness of Breath      Home Medications Prior to Admission medications   Medication Sig Start Date End Date Taking? Authorizing Provider  aspirin 81 MG tablet Take 81 mg by mouth daily.    [provider]  atorvastatin (LIPITOR) 80 MG tablet Take 80 mg by mouth daily.    [provider]  celecoxib (CELEBREX) 200 MG capsule Take 1 capsule (200 mg total) by mouth 2 (two) times daily. 06/09/22   Glyn Ade, MD  erythromycin ophthalmic ointment Place a 1/2 inch ribbon of ointment into the lower eyelid for 5 days 01/30/21   Caccavale, Sophia, PA-C  furosemide (LASIX) 20 MG tablet Take 20 mg by mouth daily.     [provider]  HYDROcodone-acetaminophen (NORCO) 10-325 MG per tablet Take 1 tablet by mouth every 6 (six) hours as needed. For pain      [provider]  levothyroxine (SYNTHROID, LEVOTHROID) 125 MCG tablet Take 125 mcg by mouth daily.      [provider]  lisinopril (PRINIVIL,ZESTRIL) 20 MG tablet Take 20 mg by mouth daily.      [provider]  meloxicam (MOBIC) 15 MG tablet  Take 1/2-1 tablet daily as needed for shoulder pain. 11/20/14   Molpus, Auburn Hester, MD  metFORMIN (GLUCOPHAGE) 500 MG tablet Take 500 mg by mouth daily with breakfast.    [provider]  TiZANidine HCl (ZANAFLEX PO) Take by mouth.    [provider]  albuterol (PROVENTIL HFA;VENTOLIN HFA) 108 (90 BASE) MCG/ACT inhaler Inhale 2 puffs into the lungs every 6 (six) hours as needed for wheezing. 12/09/10 05/10/14  Dione Booze, MD  simvastatin (ZOCOR) 20 MG tablet Take 20 mg by mouth daily.   05/10/14  [provider]      Allergies    Demerol and Penicillins    Review of Systems   Review of Systems  Respiratory:  Positive for shortness of breath.     Physical Exam   Vitals:   07/23/22 1309  BP: 129/72  Pulse: 63  Resp: 20  Temp: 97.7 F (36.5 C)  SpO2: 96%    CONSTITUTIONAL:  well-appearing, NAD NEURO:  GCS 15. Speech is goal oriented. No deficits appreciated to CN III-XII; symmetric eyebrow raise, no facial drooping, tongue midline. Patient has equal grip strength bilaterally with 5/5 strength against resistance in all major muscle groups bilaterally. Sensation to light touch intact. Patient moves extremities without ataxia. Normal finger-nose-finger. Patient ambulatory with steady gait. EYES:  eyes equal and reactive ENT/NECK:  Supple, no stridor  CARDIO: Regular  rate and rhythm, appears well-perfused  PULM:  No respiratory distress, CTAB, no wheezing or rales GI/GU:  non-distended, soft MSK/SPINE:  No gross deformities, no edema, moves all extremities  SKIN:  no rash, atraumatic   *Additional and/or pertinent findings included in MDM below    ED Results / Procedures / Treatments   Labs (all labs ordered are listed, but only abnormal results are displayed) Labs Reviewed  BASIC METABOLIC PANEL - Abnormal; Notable for the following components:      Result Value   Glucose, Bld 123 (*)    Creatinine, Ser 1.05 (*)    Calcium 8.5 (*)    All other components  within normal limits  CBC WITH DIFFERENTIAL/PLATELET  BRAIN NATRIURETIC PEPTIDE  TROPONIN I (HIGH SENSITIVITY)    EKG EKG Interpretation Date/Time:  Monday July 23 2022 13:11:01 EDT Ventricular Rate:  63 PR Interval:  189 QRS Duration:  82 QT Interval:  408 QTC Calculation: 418 R Axis:   57  Text Interpretation: Sinus rhythm Low voltage, precordial leads No significant change since last tracing Confirmed by Melene Plan [phone]) on 07/23/2022 2:28:43 PM  Radiology DG Chest 2 View  Result Date: 07/23/2022 CLINICAL DATA:  shortness of breath EXAM: CHEST - 2 VIEW COMPARISON:  12/09/2010. FINDINGS: Bilateral lung fields are clear. Bilateral costophrenic angles are clear. Normal cardio-mediastinal silhouette. No acute osseous abnormalities. The soft tissues are within normal limits. There are surgical clips in the right upper quadrant, typical of a previous cholecystectomy. IMPRESSION: No active cardiopulmonary disease. Electronically Signed   By: Jules Schick M.D.   On: 07/23/2022 13:40    Procedures Procedures    Medications Ordered in ED Medications  albuterol (VENTOLIN HFA) 108 (90 Base) MCG/ACT inhaler 2 puff (has no administration in time range)    ED Course/ Medical Decision Making/ A&P                             Medical Decision Making Amount and/or Complexity of Data Reviewed Labs: ordered. Radiology: ordered.  Risk Prescription drug management.   Discussed smoking cessation with patient and was they were offerred resources to help stop.  Total time was 5 min CPT code 60454.   Initial Impression and Ddx 62 year old well-appearing female presenting for shortness of breath.  Exam was unremarkable.  DDx includes COPD exacerbation, PE, ACS, CHF exacerbation, pneumonia, pneumothorax.  DDx for the weakness includes stroke, electrolyte derangement, dehydration Patient PMH that increases complexity of ED encounter:   COPD, CHF, diabetes and history of TIA  Interpretation  of Diagnostics I independent reviewed and interpreted the labs as followed: Hyperglycemia  - I independently visualized the following imaging with scope of interpretation limited to determining acute life threatening conditions related to emergency care: chest xray, which revealed no acute cardiopulmonary process  -I personally reviewed and interpreted EKG which revealed sinus rhythm  Patient Reassessment and Ultimate Disposition/Management Patient continued to deny chest pain and stated that her shortness of breath weakness has completely resolved.  Workup was unremarkable.  Have low suspicion for ACS, CHF exacerbation, PE, COPD exacerbation.  Suspect mild dehydration may have contributed.  Treated with oral rehydration and she tolerated well.  Discussed smoking cessation.  Discussed pertinent return precautions.  Advised to follow-up with PCP.  Vitals stable throughout encounter.  Discharged home.  Patient management required discussion with the following services or consulting groups:  None  Complexity of Problems Addressed Acute complicated illness or Injury  Additional Data Reviewed and Analyzed Further history obtained from: Further history from spouse/family member, Past medical history and medications listed in the EMR, and Prior ED visit notes  Patient Encounter Risk Assessment None         Final Clinical Impression(s) / ED Diagnoses Final diagnoses:  Shortness of breath  Weakness    Rx / DC Orders ED Discharge Orders     None         Gareth Eagle, PA-C 07/23/22 1540    Melene Plan, DO 07/24/22 [phone]

## 2022-07-23 NOTE — Discharge Instructions (Addendum)
Evaluation today was overall reassuring.  Recommend you follow-up with your PCP.  If you have worsening shortness of breath, chest pain, weakness, calf tenderness or any other concerning symptom please return emerged part for further evaluation.

## 2022-07-23 NOTE — ED Triage Notes (Signed)
Patient presents to ED via POV from home. Here with shortness of breath, generalized weakness and nausea. Able to speak full, complete sentences. Ambulatory.
